# Patient Record
Sex: Female | Born: 1975 | Race: White | Hispanic: No | Marital: Married | Smoking: Never smoker
Health system: Southern US, Community
[De-identification: ages and names within clinical notes are randomized; demographics above are authoritative.]

## PROBLEM LIST (undated history)

## (undated) DIAGNOSIS — G43909 Migraine, unspecified, not intractable, without status migrainosus: Secondary | ICD-10-CM

## (undated) DIAGNOSIS — J45909 Unspecified asthma, uncomplicated: Secondary | ICD-10-CM

## (undated) HISTORY — DX: Unspecified asthma, uncomplicated: J45.909

## (undated) HISTORY — DX: Migraine, unspecified, not intractable, without status migrainosus: G43.909

## (undated) HISTORY — PX: HEMORROIDECTOMY: SUR656

---

## 2016-01-26 ENCOUNTER — Ambulatory Visit: Payer: BLUE CROSS/BLUE SHIELD | Attending: Obstetrics & Gynecology | Admitting: Physical Therapy

## 2016-01-26 DIAGNOSIS — M6281 Muscle weakness (generalized): Secondary | ICD-10-CM | POA: Diagnosis present

## 2016-01-26 DIAGNOSIS — M79604 Pain in right leg: Secondary | ICD-10-CM | POA: Insufficient documentation

## 2016-01-26 DIAGNOSIS — M545 Low back pain, unspecified: Secondary | ICD-10-CM

## 2016-01-26 NOTE — Therapy (Signed)
[facility] MEDICINE [address] [place], [address] Phone: [phone]   Fax:  [phone]  Physical Therapy Evaluation  Patient Details  Name: Tasha Chang MRN: [id number] Date of Birth: [birth year] Referring Provider: ward, Leeroy Bock  Encounter Date: 01/26/2016      PT End of Session - 01/26/16 1702    Visit Number 1   Number of Visits 13   Date for PT Re-Evaluation 03/08/16   PT Start Time 1515   PT Stop Time 1610   PT Time Calculation (min) 55 min   Activity Tolerance Patient tolerated treatment well      No past medical history on file.  No past surgical history on file.  There were no vitals filed for this visit.       Subjective Assessment - 01/26/16 1515    Subjective Pt reports several year history of RLE injuries, including R HS tear several years ago, torn gastroc muscle. Pt also is noting steadily worsening back pain as well.  Pt was issued HS exercises which flared up her back significantly. Now pain is significant in HS and back.    Pertinent History Pt is unable to run due to HS pain.   Patient Stated Goals return to yoga, running pain free.   Currently in Pain? Yes   Pain Score 1    Pain Location Back            [facility] PT Assessment - 01/26/16 0001      Assessment   Medical Diagnosis torn hamstring   Referring Provider ward, chelsea   Prior Therapy none     Precautions   Precautions None     Restrictions   Weight Bearing Restrictions No     Balance Screen   Has the patient fallen in the past 6 months No   Has the patient had a decrease in activity level because of a fear of falling?  No   Is the patient reluctant to leave their home because of a fear of falling?  No     Home Tourist information centre manager residence     Prior Function   Level of Independence Independent   Vocation Full time employment   Vocation Requirements yoga teacher, Lexicographer,  requiring overhead signifiant strength and flexibility   Leisure playing with kids.     Functional Tests   Functional tests Other     Other:   Other/ Comments performed gait, squat, stairs, step down, single leg stance. all negative except gait with significant lack of lumbar rotation, incr. lumbar lordosis     Posture/Postural Control   Posture Comments incr. lumbar lordosis     ROM / Strength   AROM / PROM / Strength AROM;PROM;Strength     AROM   Overall AROM Comments Pt demonstrates appropriate level of mobility for a yoga instructor except significantly incr. ER B in hips.  lumbar extension limited and painful, yoga poses involving end range flexion and extension also painful.     PROM   Overall PROM Comments PROM = AROM     Strength   Overall Strength Comments strength is grossly WNL except medial HS 3/5 and painful.     Palpation   Spinal mobility pain with CPA and L UPAs L1-L5, worse in lower lumbar region.   SI assessment  pain with palpation of SI, pain with palpation of sacrotuberous ligament   Palpation comment pain with palpation of medial posterior thigh just  lateral to medial HS.             Objective: Heel slide on and off table. Performed with cuing to maintain neutral back, hollow body position.  Pt verbalized understanding of the exercise and will perform between now and next session.              PT Education - 01/26/16 1701    Education provided Yes   Education Details heel slide exercise to perform hollow body progression   Person(s) Educated Patient   Methods Explanation   Comprehension Verbalized understanding             PT Long Term Goals - 01/26/16 1710      PT LONG TERM GOAL #1   Title Pt will improve R HS strength to 5/5 pain free.   Baseline 3/5 with pain   Time 6   Period Weeks   Status New     PT LONG TERM GOAL #2   Title Pt will be able to perform full back extension necessary for work pain free   Baseline  5/10 pain in back with performance   Time 6   Period Weeks   Status New     PT LONG TERM GOAL #3   Title Pt will be able to run pain free for fitness   Baseline pain in HS with running    Time 6   Period Weeks   Status New               Plan - 01/26/16 1703    Clinical Impression Statement Pt is a pleasant 40 y/o female with c/o R HS, low back pain. Pt has had pain chronically for the past 3 yrs, worsening considerably in the past 6 months. Pt is a Marine scientist and aerialist and is able to perform all exercises with pain. Currently pt presents with pain with palpation of sacrotuberous ligament, minimal but present urinary incontinence, pain with palpation of HS, decr. lumbar spine ROM. Pt would benefit from skilled PT to address these deficits.   Rehab Potential Fair   Clinical Impairments Affecting Rehab Potential motivation, activity level.   PT Frequency 2x / week   PT Duration 6 weeks   PT Treatment/Interventions Aquatic Therapy;ADLs/Self Care Home Management;Passive range of motion;Dry needling;Therapeutic exercise;Therapeutic activities;Manual techniques   PT Next Visit Plan progress hollow body      Patient will benefit from skilled therapeutic intervention in order to improve the following deficits and impairments:  Decreased strength, Pain, Improper body mechanics, Hypomobility, Postural dysfunction  Visit Diagnosis: Pain In Right Leg - Plan: PT plan of care cert/re-cert  Bilateral low back pain without sciatica - Plan: PT plan of care cert/re-cert  Muscle weakness (generalized) - Plan: PT plan of care cert/re-cert     Problem List There are no active problems to display for this patient.   Beaux Wedemeyer PT DPT 01/26/2016, 5:15 PM  [place] [facility] PHYSICAL AND SPORTS MEDICINE [address], [place], [address] Phone: [phone]   Fax:  [phone]  Name: Tasha Chang MRN: [id number] Date of Birth:  May 20, 1976

## 2016-01-29 ENCOUNTER — Ambulatory Visit: Payer: BLUE CROSS/BLUE SHIELD | Attending: Obstetrics & Gynecology | Admitting: Physical Therapy

## 2016-01-29 DIAGNOSIS — M6281 Muscle weakness (generalized): Secondary | ICD-10-CM | POA: Diagnosis present

## 2016-01-29 DIAGNOSIS — M545 Low back pain, unspecified: Secondary | ICD-10-CM

## 2016-01-29 DIAGNOSIS — M79604 Pain in right leg: Secondary | ICD-10-CM | POA: Insufficient documentation

## 2016-01-29 NOTE — Therapy (Signed)
[facility]TS MEDICINE [address] [place], [address] Phone: [phone]   Fax:  [phone]  Physical Therapy Treatment  Patient Details  Name: Tasha Chang MRN: [id number] Date of Birth: 1976-01-13 Referring Provider: ward, Leeroy Bock  Encounter Date: 01/29/2016      PT End of Session - 01/29/16 1441    Visit Number 2   Number of Visits 13   Date for PT Re-Evaluation 03/08/16   PT Start Time 1340   PT Stop Time 1435   PT Time Calculation (min) 55 min   Activity Tolerance Patient tolerated treatment well      No past medical history on file.  No past surgical history on file.  There were no vitals filed for this visit.      Subjective Assessment - 01/29/16 1345    Subjective (P)  Pt reports she is having incr. back pain. noted difficulty with hollow body to superman progression.   Pertinent History (P)  Pt is unable to run due to HS pain.   Patient Stated Goals (P)  return to yoga, running pain free.   Currently in Pain? (P)  Yes   Pain Score (P)  1               Objective: CPAs grade IV 3x1 min T10-S1  Prone press up with manual overpressure 3x1 min.  Following this pt had 0/10 pain with prone press up.  Assessed running, noted very slow cadence of 110. Educated pt on progressing cadence to 160.   Educated pt on progressive running including avoiding excessive pain, differentiating pain from discomfort.  Performed plank, pain with performance due to firing of paraspinals. With extensive cuing for core engagement pt able to perform concentric phase pain free. Initially had difficulty with eccentric phase. With cuing able to correct this as well and perform fully pain free plank.  Reviewed split, modified split, pt had some c/o pulling in back with this.  Attempted superman, pt has pain with performance of this, able to correct with same cuing as performed with plank.  Encouraged pt to  discontinue hollow body to superman.  Performed standing pelvic tilt, noted incr. Hip flexion, cued to correct this with HS, then performed light leg swings to progress to crescent pose.                   PT Education - 01/29/16 1441    Education provided Yes   Education Details progression of running   Person(s) Educated Patient   Methods Explanation   Comprehension Verbalized understanding             PT Long Term Goals - 01/26/16 1710      PT LONG TERM GOAL #1   Title Pt will improve R HS strength to 5/5 pain free.   Baseline 3/5 with pain   Time 6   Period Weeks   Status New     PT LONG TERM GOAL #2   Title Pt will be able to perform full back extension necessary for work pain free   Baseline 5/10 pain in back with performance   Time 6   Period Weeks   Status New     PT LONG TERM GOAL #3   Title Pt will be able to run pain free for fitness   Baseline pain in HS with running    Time 6   Period Weeks   Status New  Plan - 01/29/16 1441    Clinical Impression Statement Progressed pt to return to running program, pt has very low cadence so extensive work done on this. Pt also demonstrates significant improvement in pelvic control within session with decr. pain in back when focusing on this.   Rehab Potential Fair   Clinical Impairments Affecting Rehab Potential motivation, activity level.   PT Frequency 2x / week   PT Duration 6 weeks   PT Treatment/Interventions Aquatic Therapy;ADLs/Self Care Home Management;Passive range of motion;Dry needling;Therapeutic exercise;Therapeutic activities;Manual techniques   PT Next Visit Plan progress hollow body      Patient will benefit from skilled therapeutic intervention in order to improve the following deficits and impairments:  Decreased strength, Pain, Improper body mechanics, Hypomobility, Postural dysfunction  Visit Diagnosis: Bilateral low back pain without sciatica  Pain In  Right Leg     Problem List There are no active problems to display for this patient.   Shaunak Kreis PT DPT 01/29/2016, 2:54 PM  [place] [facility] PHYSICAL AND SPORTS MEDICINE [address] [place], [address] Phone: [phone]   Fax:  [phone]  Name: Tasha Chang MRN: [id number] Date of Birth: 03/21/1976

## 2016-02-03 ENCOUNTER — Ambulatory Visit: Payer: BLUE CROSS/BLUE SHIELD | Admitting: Physical Therapy

## 2016-02-03 DIAGNOSIS — M545 Low back pain, unspecified: Secondary | ICD-10-CM

## 2016-02-03 DIAGNOSIS — M79604 Pain in right leg: Secondary | ICD-10-CM

## 2016-02-03 NOTE — Therapy (Signed)
[facility] [address] [place], [place], [address] Phone: [phone]   Fax:  [phone]  Physical Therapy Treatment  Patient Details  Name: Tasha Chang MRN: [id number] Date of Birth: 12-Jul-1975 Referring Provider: ward, Leeroy Bockchelsea  Encounter Date: 02/03/2016      PT End of Session - 02/03/16 1030    Visit Number 3   Number of Visits 13   Date for PT Re-Evaluation 03/08/16   PT Start Time 0945   PT Stop Time 1030   PT Time Calculation (min) 45 min   Activity Tolerance Patient tolerated treatment well      No past medical history on file.  No past surgical history on file.  There were no vitals filed for this visit.      Subjective Assessment - 02/03/16 0944    Subjective Pt reports incr. stiffness in back.   Pertinent History Pt is unable to run due to HS pain.   Patient Stated Goals return to yoga, running pain free.   Currently in Pain? No/denies   Pain Score 0-No pain              Objective:  HS curl on ball with extensive practice to perform with decr. Back activation, cuing for pelvic tilt, breathing.  Warrior 1 position, squaring hips, stretching into this to address psoas tightness, initially produced HS pain, with cuing to perform contract relax able to perform well, 15 min practice to produce single exercise.  Bridge progressing to single leg bridge, with adduction pt had difficulty with performance of this.  Discontinued performance of leg swing due to no difficulty with performance of things.                    PT Education - 02/03/16 1030    Education provided Yes   Education Details HEP   Person(s) Educated Patient   Methods Explanation   Comprehension Verbalized understanding             PT Long Term Goals - 01/26/16 1710      PT LONG TERM GOAL #1   Title Pt will improve R HS strength to 5/5 pain free.   Baseline 3/5 with pain   Time 6   Period Weeks   Status New     PT LONG TERM GOAL #2   Title Pt will be able to perform full back extension necessary for work pain free   Baseline 5/10 pain in back with performance   Time 6   Period Weeks   Status New     PT LONG TERM GOAL #3   Title Pt will be able to run pain free for fitness   Baseline pain in HS with running    Time 6   Period Weeks   Status New               Plan - 02/03/16 1031    Clinical Impression Statement noted hypermobility in B hips. Pt also definite improvement in motor control within session, able to perform exercises which demand core control and HS activation required extensive cuing.    Rehab Potential Fair   Clinical Impairments Affecting Rehab Potential motivation, activity level.   PT Frequency 2x / week   PT Duration 6 weeks   PT Treatment/Interventions Aquatic Therapy;ADLs/Self Care Home Management;Passive range of motion;Dry needling;Therapeutic exercise;Therapeutic activities;Manual techniques   PT Next Visit Plan progress hollow body      Patient will  benefit from skilled therapeutic intervention in order to improve the following deficits and impairments:  Decreased strength, Pain, Improper body mechanics, Hypomobility, Postural dysfunction  Visit Diagnosis: Bilateral low back pain without sciatica  Pain In Right Leg     Problem List There are no active problems to display for this patient.   Fisher,Benjamin PT DPT 02/03/2016, 10:37 AM  [facility] PHYSICAL AND SPORTS MEDICINE [address] [place], [address] Phone: [phone]   Fax:  [phone]  Name: Tasha Chang MRN: [id number] Date of Birth: 06-19-1976

## 2016-02-05 ENCOUNTER — Encounter: Payer: BLUE CROSS/BLUE SHIELD | Admitting: Physical Therapy

## 2016-02-06 ENCOUNTER — Ambulatory Visit: Payer: BLUE CROSS/BLUE SHIELD | Admitting: Physical Therapy

## 2016-02-06 DIAGNOSIS — M6281 Muscle weakness (generalized): Secondary | ICD-10-CM

## 2016-02-06 DIAGNOSIS — M545 Low back pain, unspecified: Secondary | ICD-10-CM

## 2016-02-06 NOTE — Therapy (Signed)
[facility] [address] [place], [place], [address] Phone: [phone]   Fax:  [phone]  Physical Therapy Treatment  Patient Details  Name: Tasha Chang MRN: [id number] Date of Birth: 1976-04-10 Referring Provider: ward, Leeroy Bockchelsea  Encounter Date: 02/06/2016      PT End of Session - 02/06/16 1201    Visit Number 4   Number of Visits 13   Date for PT Re-Evaluation 03/08/16   PT Start Time 1100   PT Stop Time 1150   PT Time Calculation (min) 50 min   Activity Tolerance Patient tolerated treatment well      No past medical history on file.  No past surgical history on file.  There were no vitals filed for this visit.      Subjective Assessment - 02/06/16 1058    Subjective Pt recorded several videos to show aerial yoga movements that are irritating to back   Pertinent History Pt is unable to run due to HS pain.   Patient Stated Goals return to yoga, running pain free.   Currently in Pain? No/denies                                      PT Long Term Goals - 01/26/16 1710      PT LONG TERM GOAL #1   Title Pt will improve R HS strength to 5/5 pain free.   Baseline 3/5 with pain   Time 6   Period Weeks   Status New     PT LONG TERM GOAL #2   Title Pt will be able to perform full back extension necessary for work pain free   Baseline 5/10 pain in back with performance   Time 6   Period Weeks   Status New     PT LONG TERM GOAL #3   Title Pt will be able to run pain free for fitness   Baseline pain in HS with running    Time 6   Period Weeks   Status New               Plan - 02/06/16 1201    Clinical Impression Statement overall pt is improving in control, is still having some back and HS pain but this appears to be improving with modifications of activity and manual therapy for back.   Rehab Potential Fair   Clinical Impairments Affecting Rehab  Potential motivation, activity level.   PT Frequency 2x / week   PT Duration 6 weeks   PT Treatment/Interventions Aquatic Therapy;ADLs/Self Care Home Management;Passive range of motion;Dry needling;Therapeutic exercise;Therapeutic activities;Manual techniques   PT Next Visit Plan progress hollow body      Patient will benefit from skilled therapeutic intervention in order to improve the following deficits and impairments:  Decreased strength, Pain, Improper body mechanics, Hypomobility, Postural dysfunction  Visit Diagnosis: Bilateral low back pain without sciatica  Muscle weakness (generalized)     Problem List There are no active problems to display for this patient.   Fisher,Benjamin PT DPT 02/06/2016, 12:03 PM  [place] [facility] [address] [place], [place], [address] Phone: [phone]   Fax:  [phone]  Name: Tasha Chang MRN: [id number] Date of Birth: 1976-04-10

## 2016-02-10 ENCOUNTER — Ambulatory Visit: Payer: BLUE CROSS/BLUE SHIELD | Admitting: Physical Therapy

## 2016-02-10 DIAGNOSIS — M545 Low back pain, unspecified: Secondary | ICD-10-CM

## 2016-02-11 NOTE — Therapy (Signed)
[facility]YS[facility] MEDICINE [address] [place], [place], [address] Phone: [phone]   Fax:  [phone]  Physical Therapy Treatment  Patient Details  Name: Tasha Chang MRN: [id number] Date of Birth: 1975-08-05 Referring Provider: ward, Leeroy Bockchelsea  Encounter Date: 02/10/2016      PT End of Session - 02/10/16 0717    Visit Number 5   Number of Visits 13   Date for PT Re-Evaluation 03/08/16   PT Start Time 1130   PT Stop Time 1215   PT Time Calculation (min) 45 min   Activity Tolerance Patient tolerated treatment well      No past medical history on file.  No past surgical history on file.  There were no vitals filed for this visit.      Subjective Assessment - 02/10/16 1124    Subjective Pt reports she is "no longer feeling like an old person"   Pertinent History Pt is unable to run due to HS pain.   Patient Stated Goals return to yoga, running pain free.             Objective: Jefferson curl with 20# on 12" step x3, extensive cuing for apropriate performance of this.   This curl is similar to specific movements pt is required to do for work but is currently unable to perform.  Reviewed hollow body hold, corrected breathing with this.  Sun salutation, noted poor extension in upward dog which pt utilizes for work, able to correct with manual cues, verbal cues regarding TA contraction.  Lateral flexion stretch with manual QL release/repositioning, x4 min each side.                         PT Long Term Goals - 01/26/16 1710      PT LONG TERM GOAL #1   Title Pt will improve R HS strength to 5/5 pain free.   Baseline 3/5 with pain   Time 6   Period Weeks   Status New     PT LONG TERM GOAL #2   Title Pt will be able to perform full back extension necessary for work pain free   Baseline 5/10 pain in back with performance   Time 6   Period Weeks   Status New     PT LONG TERM  GOAL #3   Title Pt will be able to run pain free for fitness   Baseline pain in HS with running    Time 6   Period Weeks   Status New               Plan - 02/10/16 0717    Clinical Impression Statement pt is now able to extend back to50% normal, compared to 30% at start of PT. when performing upward dog she still must have knees supported or she has pain but otherwise is able to make significant improvement.   Rehab Potential Fair   Clinical Impairments Affecting Rehab Potential motivation, activity level.   PT Frequency 2x / week   PT Duration 6 weeks   PT Treatment/Interventions Aquatic Therapy;ADLs/Self Care Home Management;Passive range of motion;Dry needling;Therapeutic exercise;Therapeutic activities;Manual techniques   PT Next Visit Plan progress hollow body      Patient will benefit from skilled therapeutic intervention in order to improve the following deficits and impairments:  Decreased strength, Pain, Improper body mechanics, Hypomobility, Postural dysfunction  Visit Diagnosis: Bilateral low back pain without sciatica  Problem List There are no active problems to display for this patient.   Fisher,Benjamin PT DPT 02/11/2016, 7:19 AM  [facility] [address] [place], [place], [address] Phone: [phone]   Fax:  [phone]  Name: Tasha Chang MRN: [id number] Date of Birth: [birth year]

## 2016-02-18 ENCOUNTER — Ambulatory Visit: Payer: BLUE CROSS/BLUE SHIELD | Admitting: Physical Therapy

## 2016-02-18 DIAGNOSIS — M545 Low back pain, unspecified: Secondary | ICD-10-CM

## 2016-02-19 NOTE — Therapy (Signed)
[facility] MEDIC[facility] [address] [place], [place], [address] Phone: [phone]   Fax:  [phone]  Physical Therapy Treatment  Patient Details  Name: Tasha Chang MRN: [id number] Date of Birth: 07-Nov-1975 Referring Provider: ward, Leeroy Bockchelsea  Encounter Date: 02/18/2016      PT End of Session - 02/18/16 0716    Visit Number 6   Number of Visits 13   Date for PT Re-Evaluation 03/08/16   PT Start Time 1130   PT Stop Time 1210   PT Time Calculation (min) 40 min   Activity Tolerance Patient tolerated treatment well      No past medical history on file.  No past surgical history on file.  There were no vitals filed for this visit.      Subjective Assessment - 02/18/16 0715    Subjective Pt has been doing exceptionally well, yesterday had incr. pain and is now feeling "like an old person".   Pertinent History Pt is unable to run due to HS pain.   Patient Stated Goals return to yoga, running pain free.   Currently in Pain? Yes   Pain Score 2    Pain Location Back            Objective: Modified yoga triangle pose to avoid end range pain.  Modified aerial holds to improve lat activation.  Sun salutation with pain initially with forward flexion. Performed manual STM on paraspinals, following this decr. Pain with forward flexion.  Utilized bolster for additional performance of this.  Performed plank with cuing for improved breathing and sense of "ease" with performance of this.  At end of session pt reported decr. Resting pain to 0/10.  Extensive education on taking one week off prior to return to higher level activity. Pt will perform light active recovery routine until then.                     PT Education - 02/19/16 0716    Education provided Yes   Education Details deload principles   Person(s) Educated Patient   Methods Explanation   Comprehension Verbalized understanding              PT Long Term Goals - 01/26/16 1710      PT LONG TERM GOAL #1   Title Pt will improve R HS strength to 5/5 pain free.   Baseline 3/5 with pain   Time 6   Period Weeks   Status New     PT LONG TERM GOAL #2   Title Pt will be able to perform full back extension necessary for work pain free   Baseline 5/10 pain in back with performance   Time 6   Period Weeks   Status New     PT LONG TERM GOAL #3   Title Pt will be able to run pain free for fitness   Baseline pain in HS with running    Time 6   Period Weeks   Status New               Plan - 02/18/16 0717    Clinical Impression Statement Incr. pain appears to be related to significantly incr. 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 Focus of session on pain relief and issuing HEP based around recovery from overdoing her work related activities.   Rehab Potential Fair   Clinical Impairments Affecting Rehab Potential motivation, activity level.  PT Frequency 2x / week   PT Duration 6 weeks   PT Treatment/Interventions Aquatic Therapy;ADLs/Self Care Home Management;Passive range of motion;Dry needling;Therapeutic exercise;Therapeutic activities;Manual techniques   PT Next Visit Plan progress hollow body      Patient will benefit from skilled therapeutic intervention in order to improve the following deficits and impairments:  Decreased strength, Pain, Improper body mechanics, Hypomobility, Postural dysfunction  Visit Diagnosis: Bilateral low back pain without sciatica     Problem List There are no active problems to display for this patient.   Fisher,Benjamin PT DPT 02/19/2016, 7:22 AM  [facility] MEDIC[facility] [address] [place], [place], [address] Phone: [phone]   Fax:  [phone]  Name: Tasha Chang MRN: [id number] Date of Birth: 29-Nov-1975

## 2016-02-24 ENCOUNTER — Ambulatory Visit: Payer: BLUE CROSS/BLUE SHIELD | Admitting: Physical Therapy

## 2016-02-24 DIAGNOSIS — M545 Low back pain, unspecified: Secondary | ICD-10-CM

## 2016-02-24 DIAGNOSIS — M79604 Pain in right leg: Secondary | ICD-10-CM

## 2016-02-25 NOTE — Therapy (Signed)
[place] [facility] [address], [place], [address] Phone: [phone]   Fax:  [phone]  Physical Therapy Treatment  Patient Details  Name: Tasha Chang MRN: [id number] Date of Birth: [birth year] Referring Provider: ward, Leeroy Bock  Encounter Date: 02/24/2016      PT End of Session - 02/24/16 6962    Visit Number 7   Number of Visits 13   Date for PT Re-Evaluation 03/08/16   PT Start Time 1045   PT Stop Time 1130   PT Time Calculation (min) 45 min   Activity Tolerance Patient tolerated treatment well      No past medical history on file.  No past surgical history on file.  There were no vitals filed for this visit.      Subjective Assessment - 02/24/16 0641    Subjective Overall pain has improved, pt is noting some global pain today possibly due to extended period of sitting over the weekend   Pertinent History Pt is unable to run due to HS pain.   Patient Stated Goals return to yoga, running pain free.   Currently in Pain? No/denies   Pain Score 0-No pain               Objective: Extensive and comprehensive STM performed on B QL, erector spinae, paraspinals, superior fibers of glutes, as well as MFR performed on thoracodorsal fascia.  Following this pt consented to TDN following education regarding risks. Performed maceration TDN on B QL and errector spinae to improve response to manual intervention and to improve motor control with there ex.  Pt then performed light "yin yoga" stretching, going to 70% of her range and performing breathing exercises in this position to address maintaining new pain free ROM, including side bending, child's pose, upward dog.  Following this pt reported she was pain free in child's pose for the "first time in 3 years".                  PT Education - 02/24/16 [phone]    Education provided Yes   Education Details dry needling   Person(s)  Educated Patient   Methods Explanation   Comprehension Verbalized understanding             PT Long Term Goals - 01/26/16 1710      PT LONG TERM GOAL #1   Title Pt will improve R HS strength to 5/5 pain free.   Baseline 3/5 with pain   Time 6   Period Weeks   Status New     PT LONG TERM GOAL #2   Title Pt will be able to perform full back extension necessary for work pain free   Baseline 5/10 pain in back with performance   Time 6   Period Weeks   Status New     PT LONG TERM GOAL #3   Title Pt will be able to run pain free for fitness   Baseline pain in HS with running    Time 6   Period Weeks   Status New               Plan - 02/24/16 4132    Clinical Impression Statement Pt pain fully abolished and motor control improved considerably with manual therapy and trigger point dry needling within session. 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   Rehab Potential Fair   Clinical Impairments Affecting Rehab Potential motivation, activity  level.   PT Frequency 2x / week   PT Duration 6 weeks   PT Treatment/Interventions Aquatic Therapy;ADLs/Self Care Home Management;Passive range of motion;Dry needling;Therapeutic exercise;Therapeutic activities;Manual techniques   PT Next Visit Plan progress hollow body      Patient will benefit from skilled therapeutic intervention in order to improve the following deficits and impairments:  Decreased strength, Pain, Improper body mechanics, Hypomobility, Postural dysfunction  Visit Diagnosis: Bilateral low back pain without sciatica  Pain In Right Leg     Problem List There are no active problems to display for this patient.   Gailyn Crook PT DPT 02/25/2016, 6:48 AM  [facility] [address] [place], [place], [address] Phone: [phone]   Fax:  [phone]  Name: Tasha Chang MRN: [id number] Date of Birth:  03-17-1976

## 2016-02-27 ENCOUNTER — Ambulatory Visit: Payer: BLUE CROSS/BLUE SHIELD | Attending: Obstetrics & Gynecology | Admitting: Physical Therapy

## 2016-02-27 DIAGNOSIS — M79604 Pain in right leg: Secondary | ICD-10-CM | POA: Diagnosis present

## 2016-02-27 DIAGNOSIS — M6281 Muscle weakness (generalized): Secondary | ICD-10-CM | POA: Diagnosis present

## 2016-02-27 DIAGNOSIS — M545 Low back pain, unspecified: Secondary | ICD-10-CM

## 2016-02-27 NOTE — Therapy (Signed)
[facility] [address], [place], [address] Phone: [phone]   Fax:  [phone]  Physical Therapy Treatment  Patient Details  Name: Emelly Wurtz MRN: [id number] Date of Birth: 01-Sep-1975 Referring Provider: ward, Leeroy Bock  Encounter Date: 02/27/2016      PT End of Session - 02/27/16 1021    Visit Number 8   Number of Visits 13   Date for PT Re-Evaluation 03/08/16   PT Start Time 0815   PT Stop Time 0830   PT Time Calculation (min) 15 min   Activity Tolerance Patient tolerated treatment well      No past medical history on file.  No past surgical history on file.  There were no vitals filed for this visit.      Subjective Assessment - 02/27/16 0814    Subjective Pt reports she is "still struggling" with her side bending and is in moderate pain.   Pertinent History Pt is unable to run due to HS pain.   Patient Stated Goals return to yoga, running pain free.   Currently in Pain? Yes   Pain Score 2    Pain Location Back                     Objective: CPAs grade III 2x1 min L2-L5 Following this pt reported minimal pain at rest.   Pain with forward flexion in paraspinals  Performed trigger point dry needling on B lumbar paraspinals and erector spinae. Utilized maceration technique only (no charge)  Following this pt reported decr. Pain with lumbar flexion.  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                 PT Long Term Goals - 01/26/16 1710      PT LONG TERM GOAL #1   Title Pt will improve R HS strength to 5/5 pain free.   Baseline 3/5 with pain   Time 6   Period Weeks   Status New     PT LONG TERM GOAL #2   Title Pt will be able to perform full back extension necessary for work pain free   Baseline 5/10 pain in back with performance   Time 6    Period Weeks   Status New     PT LONG TERM GOAL #3   Title Pt will be able to run pain free for fitness   Baseline pain in HS with running    Time 6   Period Weeks   Status New               Plan - 02/27/16 1021    Clinical Impression Statement Pt appears to be plateauing. Likely related to incr. 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 responded well to manual intervention today. Will look to issue HEP at next session as pt needs to end therapy soon.   Rehab Potential Fair   Clinical Impairments Affecting Rehab Potential motivation, activity level.   PT Frequency 2x / week   PT Duration 6 weeks   PT Treatment/Interventions Aquatic Therapy;ADLs/Self Care Home Management;Passive range of motion;Dry needling;Therapeutic exercise;Therapeutic activities;Manual techniques   PT Next Visit Plan progress hollow body      Patient will benefit from skilled therapeutic intervention in order to improve the following deficits and impairments:  Decreased strength, Pain, Improper  body mechanics, Hypomobility, Postural dysfunction  Visit Diagnosis: Bilateral low back pain without sciatica     Problem List There are no active problems to display for this patient.   Maddyn Lieurance PT DPT 02/27/2016, 10:24 AM  [place] [facility] [address] [place], [place], [address] Phone: [phone]   Fax:  [phone]  Name: Donna ChristenMichelle Cryer MRN: [id number] Date of Birth: January 31, 1976

## 2016-03-02 ENCOUNTER — Ambulatory Visit: Payer: BLUE CROSS/BLUE SHIELD | Admitting: Physical Therapy

## 2016-03-02 DIAGNOSIS — M545 Low back pain, unspecified: Secondary | ICD-10-CM

## 2016-03-03 ENCOUNTER — Ambulatory Visit: Payer: BLUE CROSS/BLUE SHIELD | Admitting: Physical Therapy

## 2016-03-03 NOTE — Therapy (Signed)
[place] [facility] [address] [place], [place], [address] Phone: [phone]   Fax:  [phone]  Physical Therapy Treatment  Patient Details  Name: Donna ChristenMichelle Koskela MRN: [id number] Date of Birth: Nov 24, 1975 Referring Provider: ward, Leeroy Bockchelsea  Encounter Date: 03/02/2016      PT End of Session - 03/02/16 0820    Visit Number 9   Number of Visits 13   Date for PT Re-Evaluation 03/08/16   PT Start Time 0745   PT Stop Time 0815   PT Time Calculation (min) 30 min   Activity Tolerance Patient tolerated treatment well      No past medical history on file.  No past surgical history on file.  There were no vitals filed for this visit.      Subjective Assessment - 03/02/16 0816    Subjective Pt is now fully pain free, is still having some difficulty with pain with her upward dog.   Pertinent History Pt is unable to run due to HS pain.   Patient Stated Goals return to yoga, running pain free.   Currently in Pain? No/denies   Pain Score 0-No pain               Objective: CPAs grade IV 2x30 oscillations R UPAs same grade same location, both performed at L1-L4   Trigger point dry needling on R paraspinals and erector spinae. Following this pt reported no stiffness with higher level yoga positions.  Reassessed and corrected plank performance, including earlier activation of TA for improved control. Added into multiple positions including plank, updog, mountain, pigeon. Pt able to perform well.  Pt required cuing and correction with exercise, stiffness with positioning improved with manual intervention.                  PT Education - 03/02/16 0817    Education provided Yes   Education Details motor control with upward dog    Person(s) Educated Patient   Methods Explanation             PT Long Term Goals - 01/26/16 1710      PT LONG TERM GOAL #1   Title Pt will improve R HS strength  to 5/5 pain free.   Baseline 3/5 with pain   Time 6   Period Weeks   Status New     PT LONG TERM GOAL #2   Title Pt will be able to perform full back extension necessary for work pain free   Baseline 5/10 pain in back with performance   Time 6   Period Weeks   Status New     PT LONG TERM GOAL #3   Title Pt will be able to run pain free for fitness   Baseline pain in HS with running    Time 6   Period Weeks   Status New               Plan - 03/02/16 0820    Clinical Impression Statement Pt is making progress in terms of pain, functional status. Did notice some incr. pain today within session on R paraspinals. May be appropriate for d/c at next session as she is I with HEP and is having minimal dificulty with higher level activities.   Rehab Potential Fair   Clinical Impairments Affecting Rehab Potential motivation, activity level.   PT Frequency 2x / week   PT Duration 6 weeks   PT Treatment/Interventions Aquatic Therapy;ADLs/Self Care Home  Management;Passive range of motion;Dry needling;Therapeutic exercise;Therapeutic activities;Manual techniques   PT Next Visit Plan progress hollow body      Patient will benefit from skilled therapeutic intervention in order to improve the following deficits and impairments:  Decreased strength, Pain, Improper body mechanics, Hypomobility, Postural dysfunction  Visit Diagnosis: Bilateral low back pain without sciatica     Problem List There are no active problems to display for this patient.   Miaa Latterell PT DPT 03/03/2016, 7:53 AM  [place] [facility] PH[facility] AND SPORTS MEDICINE [address], [place], [address] Phone: [phone]   Fax:  [phone]  Name: Susannah Carbin MRN: [id number] Date of Birth: [birth year]

## 2016-03-09 ENCOUNTER — Ambulatory Visit: Payer: BLUE CROSS/BLUE SHIELD | Admitting: Physical Therapy

## 2016-03-09 DIAGNOSIS — M6281 Muscle weakness (generalized): Secondary | ICD-10-CM

## 2016-03-09 DIAGNOSIS — M545 Low back pain: Secondary | ICD-10-CM | POA: Diagnosis not present

## 2016-03-09 DIAGNOSIS — M79604 Pain in right leg: Secondary | ICD-10-CM

## 2016-03-09 NOTE — Therapy (Signed)
[place] [facility] SPORTS MEDIC[facility] [address] [place], [address] Phone: [phone]   Fax:  [phone]  Physical Therapy Treatment  Patient Details  Name: Tasha Chang MRN: [id number] Date of Birth: 09-12-1975 Referring Provider: ward, Vikki Ports  Encounter Date: 03/09/2016      PT End of Session - 03/09/16 1011    Visit Number 10   Number of Visits 20   Date for PT Re-Evaluation 04/20/16   PT Start Time 0900   PT Stop Time 0945   PT Time Calculation (min) 45 min   Activity Tolerance Patient tolerated treatment well      No past medical history on file.  No past surgical history on file.  There were no vitals filed for this visit.      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   Pertinent History Pt is unable to run due to HS pain.   Patient Stated Goals return to yoga, running pain free.   Currently in Pain? Yes   Pain Score 2    Pain Location Hip               Objective: Reassessed movement based on pt report of incr. HS pain. Noted HS strength of 4/5.  HS bridge performed with wide set legs, narrow legs, single leg on LLE (good leg) deferred on RLE. Pt required extensive practice, cuing for posterior pelvic tilt to decr. Back irritation.  Figure four stretch - pain with muscle activation in this position. Noted incr. Pain with this.  Graston technique performed on insertion of HS x8 min using tool # 4 and J-stroke, petrissage technique. Following this decr. Figure four stretch.  Figure four from sitting to high kneeling practice, x5 min practice, x10 reps with GTB for resistance to achieve neutral. Pt able to perform this well.                    PT Education - 03/09/16 1011    Education provided  Yes   Education Details new HEP   Person(s) Educated Patient   Methods Explanation   Comprehension Verbalized understanding             PT Long Term Goals - 03/09/16 1014      PT LONG TERM GOAL #1   Title Pt will improve R HS strength to 5/5 pain free.   Baseline 4/5 with pain   Time 6   Period Weeks   Status Partially Met     PT LONG TERM GOAL #2   Title Pt will be able to perform full back extension necessary for work pain free   Baseline now no pain, some decr. ROM   Time 6   Period Weeks   Status Partially Met     PT LONG TERM GOAL #3   Title Pt will be able to run pain free for fitness   Baseline pain in HS with running    Time 6   Period Weeks   Status Achieved               Plan - 03/09/16 1011    Clinical Impression Statement Pt appears to be continuing to progress, despite setback in terms of HS pain since previous session. Although pt does have moderate  pain in HS pain is minimal and has clear MOI (overuse from 10 hrs of training over the weekend) and understands how to treat. Will reassess pt in 1 month unless pain returns significantly efore that time. 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 HS weakness and pain is still present so issued new exercises to address this.   Rehab Potential Fair   Clinical Impairments Affecting Rehab Potential motivation, activity level.   PT Frequency 2x / week   PT Duration 6 weeks   PT Treatment/Interventions Aquatic Therapy;ADLs/Self Care Home Management;Passive range of motion;Dry needling;Therapeutic exercise;Therapeutic activities;Manual techniques   PT Next Visit Plan progress hollow body      Patient will benefit from skilled therapeutic intervention in order to improve the following deficits and impairments:  Decreased strength, Pain, Improper body mechanics, Hypomobility, Postural dysfunction  Visit Diagnosis: Pain In Right Leg - Plan: PT  plan of care cert/re-cert  Muscle weakness (generalized) - Plan: PT plan of care cert/re-cert     Problem List There are no active problems to display for this patient.   Rene Gonsoulin PT DPT 03/09/2016, 10:17 AM  [place] [facility]ORTS MEDIC[facility] [address] [place], [address] Phone: [phone]   Fax:  [phone]  Name: Tasha Chang MRN: [id number] Date of Birth: [birth year]

## 2016-04-12 ENCOUNTER — Ambulatory Visit: Payer: BLUE CROSS/BLUE SHIELD | Attending: Obstetrics & Gynecology | Admitting: Physical Therapy

## 2016-04-12 DIAGNOSIS — G8929 Other chronic pain: Secondary | ICD-10-CM | POA: Diagnosis present

## 2016-04-12 DIAGNOSIS — M545 Low back pain, unspecified: Secondary | ICD-10-CM

## 2016-04-12 DIAGNOSIS — M79604 Pain in right leg: Secondary | ICD-10-CM | POA: Insufficient documentation

## 2016-04-13 NOTE — Therapy (Signed)
[place] [facility] AND SPORTS MEDICINE [address] [place], [address] Phone: [phone]   Fax:  [phone]  Physical Therapy Treatment  Patient Details  Name: Tayra Dawe MRN: [id number] Date of Birth: 10-19-1975 Referring Provider: ward, Vikki Ports  Encounter Date: 04/12/2016      PT End of Session - 04/12/16 0812    Visit Number 11   Number of Visits 20   Date for PT Re-Evaluation 04/20/16   PT Start Time 0845   PT Stop Time 0925   PT Time Calculation (min) 40 min   Activity Tolerance Patient tolerated treatment well      No past medical history on file.  No past surgical history on file.  There were no vitals filed for this visit.      Subjective Assessment - 04/12/16 0810    Subjective Pt reports HS pain has resolved. Back pain has worsened but is stable. Pt has performed significantly incr.    Pertinent History Pt is unable to run due to HS pain.   Patient Stated Goals return to yoga, running pain free.   Currently in Pain? Yes   Pain Score 3    Pain Location Back               Objective: Reviewed HEP, corrected standing posture with MET for hip extension.  Reviewed self care exercises. Issued new exercise of deep breathing in plank position. Able to perform 3x5 slow deep breaths in this position with appropriate trunk and pelvic floor activation.  Extensive STM performed on paraspinals, erectors, QL. Following this pt reported decr. Pain to 1/10. Followed up with trigger point dry needling (no charge) for lumbar multifidi. Following this reported 0/10 pain.  Reassessed pt goals, all goals met.                  PT Education - 04/12/16 [phone]    Education provided Yes   Education Details discharge instructions   Person(s) Educated Patient   Methods Explanation             PT Long Term Goals - 04/12/16 0814      PT LONG TERM GOAL #1   Title Pt will improve R HS strength  to 5/5 pain free.   Baseline 4/5 with pain   Time 6   Period Weeks   Status Achieved     PT LONG TERM GOAL #2   Title Pt will be able to perform full back extension necessary for work pain free   Baseline now no pain, some decr. ROM   Time 6   Period Weeks   Status Achieved     PT LONG TERM GOAL #3   Title Pt will be able to run pain free for fitness   Baseline pain in HS with running    Time 6   Period Weeks   Status Achieved               Plan - 04/12/16 4196    Clinical Impression Statement Although pt continues to have mild pain in back, overall her symptoms have improved and pt is I with strategies for improving pain when it worsens. Due to her job requirements she is unlikely to fully resolve pain, however she is able to peform at a high level and manage pain on her own. Encouraged pt to look for massage therapist and to continue with HEP.   Rehab Potential Fair   Clinical Impairments Affecting Rehab  Potential motivation, activity level.   PT Frequency 2x / week   PT Duration 6 weeks   PT Treatment/Interventions Aquatic Therapy;ADLs/Self Care Home Management;Passive range of motion;Dry needling;Therapeutic exercise;Therapeutic activities;Manual techniques   PT Next Visit Plan progress hollow body      Patient will benefit from skilled therapeutic intervention in order to improve the following deficits and impairments:  Decreased strength, Pain, Improper body mechanics, Hypomobility, Postural dysfunction  Visit Diagnosis: Pain in right leg  Chronic bilateral low back pain without sciatica     Problem List There are no active problems to display for this patient.   Navid Lenzen PT DPT 04/13/2016, 8:15 AM  [place] [facility] [address] [place], [address] Phone: [phone]   Fax:  [phone]  Name: Breuna Loveall MRN: [id number] Date of Birth: 12/06/1975

## 2019-04-11 ENCOUNTER — Other Ambulatory Visit: Payer: Self-pay

## 2019-04-11 ENCOUNTER — Ambulatory Visit: Payer: BC Managed Care – PPO

## 2019-04-11 DIAGNOSIS — Z23 Encounter for immunization: Secondary | ICD-10-CM

## 2019-10-08 ENCOUNTER — Other Ambulatory Visit: Payer: Self-pay

## 2019-10-08 ENCOUNTER — Ambulatory Visit: Payer: BC Managed Care – PPO | Attending: Nurse Practitioner | Admitting: Occupational Therapy

## 2019-10-08 DIAGNOSIS — M25531 Pain in right wrist: Secondary | ICD-10-CM | POA: Diagnosis present

## 2019-10-08 DIAGNOSIS — M25532 Pain in left wrist: Secondary | ICD-10-CM | POA: Insufficient documentation

## 2019-10-08 DIAGNOSIS — M6281 Muscle weakness (generalized): Secondary | ICD-10-CM | POA: Diagnosis present

## 2019-10-08 NOTE — Patient Instructions (Signed)
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

## 2019-10-08 NOTE — Therapy (Signed)
[facility] [address] [place], [address] Phone: [phone]   Fax:  [phone]  Occupational Therapy Evaluation  Patient Details  Name: Tasha Chang MRN: [id number] Date of Birth: [birth year] Referring Provider (OT): Meryl Crutch   Encounter Date: 10/08/2019  OT End of Session - 10/08/19 1604    Visit Number  1    Number of Visits  6    Date for OT Re-Evaluation  11/19/19    OT Start Time  1302    OT Stop Time  1359    OT Time Calculation (min)  57 min    Activity Tolerance  Patient tolerated treatment well    Behavior During Therapy  [facility] for tasks assessed/performed       No past medical history on file.   There were no vitals filed for this visit.  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  Yes    Pain Score  6     Pain Location  Wrist    Pain Orientation  Right;Left    Pain Descriptors / Indicators  Aching;Tightness;Shooting    Pain Type  Acute pain    Pain Onset  More than a month ago    Pain Frequency  Intermittent    Aggravating Factors   supination , pronation end range . lifting with palms , weight bearing        [facility] OT Assessment - 10/08/19 0001      Assessment   Medical Diagnosis  Bilateral wrist pain     Referring Provider (OT)  Karma Greaser, [place]    Onset Date/Surgical Date  09/07/19    Hand Dominance  Right    Prior Therapy  --   2017 for leg     Home  Environment   Lives With  Family      Prior Function   Leisure  has own studio teaching aerial aerobics ,       AROM   Overall AROM Comments  --   pain over FCU with sup,pron end range, flex, ext     Strength   Right Hand Grip (lbs)  78    Right Hand Lateral Pinch  18 lbs    Right Hand 3 Point Pinch  18 lbs    Left Hand Grip (lbs)  75    Left Hand Lateral Pinch  17 lbs    Left Hand 3 Point Pinch  16 lbs          Pt to wear wrist widgets during day with all activities and keep pain under 2/10  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 or do classes And AROM 1-2 x day full range -but stop prior to pain  And can do wrist ext and flexion stretches end range pain free               OT Education - 10/08/19 1604    Education Details  findings of eval and HEP    Person(s) Educated  Patient    Methods  Explanation;Demonstration;Tactile cues    Comprehension  Returned demonstration;Verbalized understanding          OT Long Term Goals - 10/08/19 1610      OT LONG TERM GOAL #1   Title  Pt to show pain free AROM at bilateral wrist to be able to iniate strengthening    Baseline  pain with last 20 degrees of pronation ,and 10 sup , wrist pain flexion ,ext pain with resistance end range    Time  3    Period  Weeks    Status  New    Target Date  10/29/19      OT LONG TERM GOAL #2   Title  Pt tolerate strengthening to bilateral wrist pain free to return to doing her workouts including flamingo tech    Baseline  pain with resistance end range for wrist flex, ext, sup , pron , grip , 3 point grip    Time  6    Period  Weeks    Target Date  11/19/19      OT LONG TERM GOAL #3   Title  Pt to be pain free using hands in ADL's and IADL"s without using wrist widget    Baseline  using  wrist widgets , and pain 6-9/10 with use certain tasks    Time  6    Status  New    Target Date  11/19/19            Plan - 10/08/19 1605    Clinical Impression Statement  Pt present at OT eval with onset of bilateral wrist pain L >R since about month ago - she do earial aerobics and has studio - started new techniques about month ago with momentum and force on wrist - pt pain can increase between 5-9/10 the last month and at rest 0/10 - pt do show some pain at end range sup , pron. Pain with resistance to wrist flexion ,ext over FCU - and when palm loaded in neutral - less pain with WB and no pain with palpation , wrist traction and joint mobs - pt to wear wrist widgets most all the time , keep pain under 2/10 - hold off on beat work ,and do ice - maintain pain free AROM and follow up in week    OT Occupational Profile and History  Problem Focused Assessment - Including review of records relating to presenting problem    Occupational performance deficits (Please refer to evaluation for details):  ADL's;IADL's;Work;Play;Leisure    Body Structure / Function / Physical Skills  IADL;Pain;Decreased knowledge of precautions;UE functional use;Strength    Rehab Potential  Good    Comorbidities Affecting Occupational Performance:  None    Modification or Assistance to Complete Evaluation   No modification of tasks or assist necessary to complete eval    OT Frequency  1x / week    OT Duration  6 weeks    OT Treatment/Interventions  Passive range of motion;Manual Therapy;Contrast Bath;Cryotherapy;Ultrasound;Fluidtherapy;Splinting;Patient/family education;Therapeutic exercise    Plan  assess progress iwth HEP    OT Home  Exercise Plan  see pt instruction    Consulted and Agree with Plan of Care  Patient       Patient will benefit from skilled therapeutic intervention in order to improve the following deficits and impairments:   Body Structure / Function / Physical Skills: IADL, Pain, Decreased  knowledge of precautions, UE functional use, Strength       Visit Diagnosis: Pain in left wrist - Plan: Ot plan of care cert/re-cert  Pain in right wrist - Plan: Ot plan of care cert/re-cert  Muscle weakness (generalized) - Plan: Ot plan of care cert/re-cert    Problem List There are no problems to display for this patient.   Rosalyn Gess OTR/L,CLT 10/08/2019, 4:19 PM  [place] [facility] [address], [place], [address] Phone: [phone]   Fax:  [phone]  Name: Tasha Chang MRN: [id number] Date of Birth: Apr 28, 1976

## 2019-10-15 ENCOUNTER — Ambulatory Visit: Payer: BC Managed Care – PPO | Admitting: Occupational Therapy

## 2019-10-15 ENCOUNTER — Other Ambulatory Visit: Payer: Self-pay

## 2019-10-15 DIAGNOSIS — M25531 Pain in right wrist: Secondary | ICD-10-CM

## 2019-10-15 DIAGNOSIS — M25532 Pain in left wrist: Secondary | ICD-10-CM

## 2019-10-15 DIAGNOSIS — M6281 Muscle weakness (generalized): Secondary | ICD-10-CM

## 2019-10-15 NOTE — Patient Instructions (Signed)
See note

## 2019-10-15 NOTE — Therapy (Signed)
[place] [facility] [address] [place], [address] Phone: [phone]   Fax:  [phone]  Occupational Therapy Treatment  Patient Details  Name: Tasha Chang MRN: [id number] Date of Birth: 10-Jul-1975 Referring Provider (OT): Margurite Auerbach   Encounter Date: 10/15/2019  OT End of Session - 10/15/19 1554    Visit Number  2    Number of Visits  6    Date for OT Re-Evaluation  11/19/19    OT Start Time  1450    OT Stop Time  1533    OT Time Calculation (min)  43 min    Activity Tolerance  Patient tolerated treatment well    Behavior During Therapy  [facility] for tasks assessed/performed       No past medical history on file.   There were no vitals filed for this visit.  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  Yes    Pain Score  3     Pain Location  Wrist    Pain Orientation  Right;Left    Pain Descriptors / Indicators  Aching;Shooting;Tightness    Pain Type  Acute pain    Aggravating Factors   sup, pronation end range       Pt report she was able to keep pain under 2/10 with use of wrist widgets during day and at teaching classes.  she could tell difference - also looking into alternatives fabric and power to give more friction to hold on better to fabric As well as  looking into changing her grip  Pain was less in RD, UD ,flexion and ext this date end range  Cont to have some pain L >R with sup ,pro end range - last 5 -10 degrees   Still no pain with compression , traction , joint mobs and rotation of wrists             OT Treatments/Exercises (OP) - 10/15/19 0001      RUE Fluidotherapy   Number Minutes Fluidotherapy  8 Minutes    RUE Fluidotherapy Location  Wrist    Comments  AROM in all planes pain free piror to reassessment of ROM and strenght       LUE Fluidotherapy   Number Minutes Fluidotherapy  8 Minutes    LUE Fluidotherapy Location  Wrist    Comments  in all planes pain free and prior to reassessment of pain in all planes       less pain with sup/pronation end range with resistance - could go further into 95 degrees - pt to use heat at home prior to sup /pro end range   Review and add isometric strengthening for flexion, ext,  RD, UD - 10-12 reps  3 days if pain free  Increase 2nd set -and another 3 days if pain free 3 sets  10-12 reps 2 x day   Wrist widget on the wrist that apply pressure - and no widget on wrist that is exercising ,       OT Education - 10/15/19 1554    Education Details  progress and upgrade on HEP    Person(s) Educated  Patient    Methods  Explanation;Demonstration;Tactile cues    Comprehension  Returned demonstration;Verbalized understanding          OT Long Term Goals - 10/08/19 1610      OT LONG TERM GOAL #1   Title  Pt to show pain free AROM at bilateral wrist to be able to iniate strengthening    Baseline  pain with last 20 degrees of pronation ,and 10 sup , wrist pain flexion ,ext pain with resistance end range    Time  3    Period  Weeks    Status  New    Target Date  10/29/19      OT LONG TERM GOAL #2   Title  Pt tolerate strengthening to bilateral wrist pain free to return to doing her workouts including flamingo tech    Baseline  pain with resistance end range for wrist  flex, ext, sup , pron , grip , 3 point grip    Time  6    Period  Weeks    Target Date  11/19/19      OT LONG TERM GOAL #3   Title  Pt to be pain free using hands in ADL's and IADL"s without using wrist widget    Baseline  using wrist widgets , and pain 6-9/10 with use certain tasks    Time  6    Status  New    Target Date  11/19/19            Plan - 10/15/19 1555    Clinical Impression Statement  Pt cont to show no pain with joint mobst, traction , rotation to wrist , pain on L with resistance to flexion ,ext less than 2/10 , last 5-10 degrees of sup/pro with resistance 3/10 but less after fluidotherapy- pt to cont with wrist widgets during day ,and add isometric for end range wrist flexion ,ext, RD, UD , but not for sup /pro = cont to work on sup /pro end range AROM pain free    OT Occupational Profile and History  Problem Focused Assessment - Including review of records relating to presenting problem    Occupational performance deficits (Please refer to evaluation for details):  ADL's;IADL's;Work;Play;Leisure    Body Structure / Function / Physical Skills  IADL;Pain;Decreased knowledge of precautions;UE functional use;Strength    Rehab Potential  Good    Clinical Decision Making  Several treatment options, min-mod task modification necessary    Comorbidities Affecting Occupational Performance:  None    Modification or Assistance to Complete Evaluation   No modification of tasks or assist necessary to complete eval    OT Frequency  1x / week    OT Duration  --   5 wks   OT Treatment/Interventions  Passive range of motion;Manual Therapy;Contrast Bath;Cryotherapy;Ultrasound;Fluidtherapy;Splinting;Patient/family education;Therapeutic exercise    Plan  assess progress iwth HEP    OT Home Exercise Plan  see pt instruction       Patient will benefit from skilled therapeutic intervention in order to improve the following  deficits and impairments:   Body Structure / Function /  Physical Skills: IADL, Pain, Decreased knowledge of precautions, UE functional use, Strength       Visit Diagnosis: Pain in left wrist  Pain in right wrist  Muscle weakness (generalized)    Problem List There are no problems to display for this patient.   Oletta Cohn OTR/l,CLT 10/15/2019, 3:58 PM  [facility] [facility] SPORTS MEDIC[facility] [address] [place], [address] Phone: [phone]   Fax:  [phone]  Name: Leith Hedlund MRN: [id number] Date of Birth: 12/06/1975

## 2019-10-22 ENCOUNTER — Ambulatory Visit: Payer: BC Managed Care – PPO | Admitting: Occupational Therapy

## 2019-10-22 ENCOUNTER — Other Ambulatory Visit: Payer: Self-pay

## 2019-10-22 DIAGNOSIS — M25532 Pain in left wrist: Secondary | ICD-10-CM

## 2019-10-22 DIAGNOSIS — M25531 Pain in right wrist: Secondary | ICD-10-CM

## 2019-10-22 DIAGNOSIS — M6281 Muscle weakness (generalized): Secondary | ICD-10-CM

## 2019-10-22 NOTE — Therapy (Signed)
[facility] [address] [place], [place], [address] Phone: [phone]   Fax:  [phone]  Occupational Therapy Treatment  Patient Details  Name: Tasha Chang MRN: [id number] Date of Birth: Oct 02, 1975 Referring Provider (OT): Meryl Crutch   Encounter Date: 10/22/2019  OT End of Session - 10/22/19 1617    Visit Number  3    Number of Visits  6    Date for OT Re-Evaluation  11/19/19    OT Start Time  1520    OT Stop Time  1605    OT Time Calculation (min)  45 min    Activity Tolerance  Patient tolerated treatment well    Behavior During Therapy  [facility] for tasks assessed/performed          Subjective Assessment - 10/22/19 1614    Subjective   Feels about the same - can get little further over - why can I go further over with my wrist if I add my shoulder - the heat do help    Pertinent History  Pain started about month ago - after starting to do some 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  she could tell difference - also looking into alternatives fabric and power to give more friction to hold on better to fabric As well as looking into changing her grip- more radial grip  No pain this date RD, UD  ,flexion and ext this date resistance Cont to have some pain L >R with sup ,pro end range with resistance - but can do 95 sup without pain - when adding shoulder with both - pain 1/10    Still no pain with compression , traction , joint mobs and rotation of wrists             OT Treatments/Exercises (OP) - 10/22/19 0001      RUE Fluidotherapy   Number Minutes Fluidotherapy  7 Minutes    RUE Fluidotherapy Location  Wrist    Comments  AROM in all planes for wrist       LUE Fluidotherapy   Number Minutes Fluidotherapy  7 Minutes    LUE Fluidotherapy Location  Wrist    Comments  AROm in all planes         less pain with sup/pronation end range after fluido - even  with resistance - could go further into 95 degrees and add shoulder  - pt to cont to  use heat at home prior to sup /pro end range , add shoulder to sup ,pronation -  simulating her aerial moves , and can pull with YTB at top of doorway achor - step away - if pain free by end of week with 2 x 12 - can add simulating of aerial move  Add and upgrade YTB for RD, UD, wrist extention  12 reps  3 days if pain free  Increase 2nd set -and another 3 days if pain free 3 sets  10-12 reps 2 x day       OT Education - 10/22/19 1616    Education Details  progress and upgrade on HEP    Person(s) Educated  Patient    Methods  Explanation;Demonstration;Tactile cues    Comprehension  Returned demonstration;Verbalized understanding          OT Long Term Goals - 10/08/19 1610      OT LONG TERM GOAL #1   Title  Pt to show pain free AROM at bilateral wrist to be able to iniate strengthening    Baseline  pain with last 20 degrees of pronation ,and 10 sup , wrist pain flexion ,ext pain with resistance end range    Time  3    Period  Weeks    Status  New    Target Date  10/29/19      OT LONG TERM GOAL #2   Title  Pt tolerate strengthening to bilateral wrist pain free to return to doing her workouts including flamingo  tech    Baseline  pain with resistance end range for wrist flex, ext, sup , pron , grip , 3 point grip    Time  6    Period  Weeks    Target Date  11/19/19      OT LONG TERM GOAL #3   Title  Pt to be pain free using hands in ADL's and IADL"s without using wrist widget    Baseline  using wrist widgets , and pain 6-9/10 with use certain tasks    Time  6    Status  New    Target Date  11/19/19            Plan - 10/22/19 1617    Clinical Impression Statement  Pt cont to show increase AROM with less pain - now only pain with resistance to pronation , sup end range L more than R - but after fluido less pain. Able to add resistance with YTB this date except for flexion - and some tightness with graston tool on ulnar and radial forearm    OT Occupational Profile and History  Problem Focused Assessment - Including review of records relating to presenting problem    Occupational performance deficits (Please refer to evaluation for details):  ADL's;IADL's;Work;Play;Leisure    Body Structure / Function / Physical Skills  IADL;Pain;Decreased knowledge of precautions;UE functional use;Strength    Rehab Potential  Good    Clinical Decision Making  Several treatment options, min-mod task modification necessary    Comorbidities Affecting Occupational Performance:  None    Modification or Assistance to Complete Evaluation   No modification of tasks or assist necessary to complete eval    OT Frequency  1x / week    OT Duration  4 weeks    OT Treatment/Interventions  Passive range of motion;Manual Therapy;Contrast Bath;Cryotherapy;Ultrasound;Fluidtherapy;Splinting;Patient/family education;Therapeutic exercise    Plan  assess progress iwth HEP    OT Home Exercise Plan  see pt instruction    Consulted and Agree with Plan of Care  Patient  Patient will benefit from skilled therapeutic intervention in order to improve the following deficits and impairments:   Body Structure / Function / Physical  Skills: IADL, Pain, Decreased knowledge of precautions, UE functional use, Strength       Visit Diagnosis: Pain in left wrist  Pain in right wrist  Muscle weakness (generalized)    Problem List There are no problems to display for this patient.   Rosalyn Gess OTR/l,CLT 10/22/2019, 4:19 PM  [place] [facility] [address] [place], [address] Phone: [phone]   Fax:  [phone]  Name: Chelbi Herber MRN: [id number] Date of Birth: 12-May-1976

## 2019-10-22 NOTE — Patient Instructions (Signed)
See note

## 2019-10-29 ENCOUNTER — Other Ambulatory Visit: Payer: Self-pay

## 2019-10-29 ENCOUNTER — Ambulatory Visit: Payer: BC Managed Care – PPO | Attending: Obstetrics & Gynecology | Admitting: Occupational Therapy

## 2019-10-29 DIAGNOSIS — M6281 Muscle weakness (generalized): Secondary | ICD-10-CM | POA: Diagnosis present

## 2019-10-29 DIAGNOSIS — M25532 Pain in left wrist: Secondary | ICD-10-CM | POA: Insufficient documentation

## 2019-10-29 DIAGNOSIS — M25531 Pain in right wrist: Secondary | ICD-10-CM

## 2019-10-29 NOTE — Therapy (Signed)
[facility] [address], [place], [address] Phone: [phone]   Fax:  [phone]  Occupational Therapy Treatment  Patient Details  Name: Tasha Chang MRN: [id number] Date of Birth: 24-Dec-1975 Referring Provider (OT): Meryl Crutch   Encounter Date: 10/29/2019  OT End of Session - 10/29/19 1511    Visit Number  4    Number of Visits  6    Date for OT Re-Evaluation  11/19/19    OT Start Time  0900    OT Stop Time  0944    OT Time Calculation (min)  44 min    Activity Tolerance  Patient tolerated treatment well    Behavior During Therapy  [facility] for tasks assessed/performed       No past medical history on file.  There were no vitals filed for this visit.  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 Wearing about 80% of the time. She can tell difference - also looking into alternatives fabric and power to give more friction to hold on better to fabric As well as looking into changing her grip- more radial grip  No pain this date RD, UD ,flexion and ext this date resistance Had no pain with supination and with adding external rotation  Cont to have some pain L >R with pro end range elbow to side and adding internal rotation- but can do 95 on both - but pain 1/10   Still no pain with compression , traction , joint mobs and rotation of wrists        OT Treatments/Exercises (OP) - 10/29/19 0001      RUE Fluidotherapy   Number Minutes Fluidotherapy  8 Minutes    RUE  Fluidotherapy Location  Wrist    Comments  AROM for wrist in all planes to decrease pain and stiffness       LUE Fluidotherapy   Number Minutes Fluidotherapy  8 Minutes    LUE Fluidotherapy Location  Wrist    Comments  AROM for wrist in all planes to decrease pain and stiffness       Simulated with her fabric -doing some of her aerial moves - including flamingo - had pain 3/10 with taking body weight, then 1-2/10 with using wrist widgets , and then after fluido therapy was 0-1/10     less pain with sup/pronation end range after fluido - even  with resistance - could go further into 95 degrees and add shoulder  - pt to cont to  use heat at home prior to sup /pro end range , add shoulder to sup ,pronation - simulating her aerial moves ,B upgrade to doing pull with RTB or GTB at top of doorway achor - step away - if pain free by end of week with 2 x 12 - can add simulating of aerial move  upgrade RTB for RD, UD, wrist extention  12 reps  3 days if pain free  Increase 2nd set -and another 3 days if pain free 3 sets  10-12 reps 2 x day   And add GTB for  mid and lower traps- 12-15 reps pain free  2 sets  And then increase to 3 sets in 3 days   Still use widgets to stay pain free during day        OT Education - 10/29/19 1511    Education Details  progress and upgrade on HEP    Person(s) Educated  Patient    Methods  Explanation;Demonstration;Tactile cues    Comprehension  Returned demonstration;Verbalized understanding          OT Long Term Goals - 10/08/19 1610      OT LONG TERM GOAL #1   Title  Pt to show pain free AROM at bilateral wrist to be able to iniate strengthening    Baseline  pain with last 20 degrees of pronation ,and 10 sup , wrist pain flexion ,ext pain with resistance end range    Time  3    Period  Weeks    Status  New    Target Date  10/29/19      OT LONG TERM GOAL #2   Title  Pt tolerate strengthening to bilateral wrist pain free to return to doing her workouts including flamingo tech    Baseline  pain with resistance end range for wrist flex, ext, sup , pron , grip , 3 point grip    Time  6    Period  Weeks    Target Date  11/19/19      OT LONG TERM GOAL #3   Title  Pt to be pain free using hands in ADL's and IADL"s without using wrist widget    Baseline  using wrist widgets , and pain 7-4/25 with use certain tasks    Time  6    Status  New    Target Date  11/19/19            Plan - 10/29/19 1511    Clinical Impression Statement  Pt cont to make progress in AROM with less pain - now only pain with end range pronation with internal rotation with shoulder - and then did increase with simulation on fabric to 3/10 -  but then with wrist widgets and then after fluido decrease to 1-0/10 - upgrade her HEP with increase resistance bands    OT Occupational Profile and History  Problem Focused Assessment - Including review of records relating to presenting problem    Occupational performance deficits (Please refer to evaluation for details):  ADL's;IADL's;Work;Play;Leisure    Body Structure / Function  / Physical Skills  IADL;Pain;Decreased knowledge of precautions;UE functional use;Strength    Rehab Potential  Good    Clinical Decision Making  Several treatment options, min-mod task modification necessary    Comorbidities Affecting Occupational Performance:  None    Modification or Assistance to Complete Evaluation   No modification of tasks or assist necessary to complete eval    OT Frequency  1x / week    OT Duration  4 weeks    OT Treatment/Interventions  Passive range of motion;Manual Therapy;Contrast Bath;Cryotherapy;Ultrasound;Fluidtherapy;Splinting;Patient/family education;Therapeutic exercise    Plan  assess progress iwth HEP    OT Home Exercise Plan  see pt instruction    Consulted and Agree with Plan of Care  Patient       Patient will benefit from skilled therapeutic intervention in order to improve the following deficits and impairments:   Body Structure / Function / Physical Skills: IADL, Pain, Decreased knowledge of precautions, UE functional use, Strength       Visit Diagnosis: Pain in left wrist  Pain in right wrist  Muscle weakness (generalized)    Problem List There are no problems to display for this patient.   Oletta Cohn OTR/l,CLT 10/29/2019, 4:14 PM  [place] [facility] PHYSICAL AND SPORTS MEDICINE [address] [place], [address] Phone: [phone]   Fax:  [phone]  Name: Tasha Chang MRN: [id number] Date of Birth: 1976-02-26

## 2019-10-29 NOTE — Patient Instructions (Signed)
See note

## 2019-11-05 ENCOUNTER — Ambulatory Visit: Payer: BC Managed Care – PPO | Admitting: Occupational Therapy

## 2019-11-05 ENCOUNTER — Other Ambulatory Visit: Payer: Self-pay

## 2019-11-05 DIAGNOSIS — M25532 Pain in left wrist: Secondary | ICD-10-CM

## 2019-11-05 DIAGNOSIS — M6281 Muscle weakness (generalized): Secondary | ICD-10-CM

## 2019-11-05 DIAGNOSIS — M25531 Pain in right wrist: Secondary | ICD-10-CM

## 2019-11-05 NOTE — Therapy (Signed)
[place] [facility] SPORTS MEDIC[facility] [address], [place], [address] Phone: [phone]   Fax:  [phone]  Occupational Therapy Treatment  Patient Details  Name: Tasha Chang MRN: [id number] Date of Birth: [birth year] Referring Provider (OT): Margurite Auerbach   Encounter Date: 11/05/2019  OT End of Session - 11/05/19 1611    Visit Number  5    Number of Visits  6    Date for OT Re-Evaluation  11/19/19    OT Start Time  1412    OT Stop Time  1505    OT Time Calculation (min)  53 min    Activity Tolerance  Patient tolerated treatment well    Behavior During Therapy  [facility] for tasks assessed/performed       No past medical history on file.   There were no vitals filed for this visit.  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  Yes    Pain Score  3     Pain Location  Wrist    Pain Orientation  Right;Left    Pain Descriptors / Indicators  Tightness;Aching    Aggravating Factors   all the way in pronation , intenal rotation with extended pinkie L more than R - and eagle arm stretch       Pt report she did use widget with Friday and Sat perfroming sessions - had no pain  But did not wear it at all yesterday and was little sore - did do biking   Before wearing it about 80%  of the time. She can tell difference - also looking into alternatives fabric and power to give more friction to hold on better to fabric As well as looking into changing her grip- more radial grip  No pain this dateRD, UD ,flexion and ext this date resistance Had no pain with supination and with adding external rotation  Cont to have some pain L >R with pro end range elbow to side and adding internal rotation- but can do 95 on both - but pain 1/10 Eagle stretch was before every easy for her - got 50% and had pain 4-5/10    Still no pain with compression , traction , joint mobs and rotation             OT Treatments/Exercises (OP) - 11/05/19 0001      RUE Fluidotherapy   Number Minutes Fluidotherapy  8 Minutes    RUE Fluidotherapy Location  Wrist    Comments  decrease pain with AROM afterwards       LUE Fluidotherapy   Number Minutes Fluidotherapy  8 Minutes    LUE Fluidotherapy Location  Wrist  Comments  decrease pain afterwards in AROM      Done some graston tool sweeping and brushing on volar wrist and forearm -and was tight ulnar side - pt to cont to massage - and do heat prior to stetches    Simulated with thick dowel  -doing  Flamingo grip - and OT pull some of her body weight - no pain  - after fluido therapy was 0-1/10     less pain with sup/pronation end rangeafter fluido - evenwith resistance - could go further into 95 degrees and add shoulder- pt to cont touse heat at home prior to sup /pro end range , add shoulder to sup ,pronation - simulating her aerial moves ,B upgrade to doing pull with RTB or GTB at top of doorway achor - step away - if pain free by end of week with 2 x 12 - can add simulating of aerial move  Eagle stretch she could do 3/4 of the range before pain increase- will assess next time again   HEP kept the same -because of her performing 2 nights she did not completer her HEP   HEP  RTB for RD, UD, wrist extention12 reps  3  days if pain free  Increase 2nd set -and another 3 days if pain free 3 sets  10-12 reps 2 x day  And add GTB for mid and lower traps- 12-15 reps pain free  2 sets  And then increase to 3 sets in 3 days   Still use widgets to stay pain free during day - 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         OT Education - 11/05/19 1611    Education Details  progress and upgrade on HEP    Person(s) Educated  Patient    Methods  Explanation;Demonstration;Tactile cues    Comprehension  Returned demonstration;Verbalized understanding          OT Long Term Goals - 10/08/19 1610      OT LONG TERM GOAL #1   Title  Pt to show pain free AROM at bilateral wrist to be able to iniate strengthening    Baseline  pain with last 20 degrees of pronation ,and 10 sup , wrist pain flexion ,ext pain with resistance end range    Time  3    Period  Weeks    Status  New    Target Date  10/29/19      OT LONG TERM GOAL #2   Title  Pt tolerate strengthening to bilateral wrist pain free to return to doing her workouts including flamingo tech    Baseline  pain with resistance end range for wrist flex, ext, sup , pron , grip , 3 point grip    Time  6    Period  Weeks    Target Date  11/19/19      OT LONG TERM GOAL #3   Title  Pt to be pain free using hands in ADL's and IADL"s without using wrist widget    Baseline  using wrist widgets , and pain 6-9/10 with use certain tasks    Time  6    Status  New    Target Date  11/19/19            Plan - 11/05/19 1612    Clinical Impression Statement  Pt done soe performing this past weekend and did not had any pain - did not wear widgets yesterday - no pain - with heat  and soft tissue pain decrease with end range and eagle stretch could go further    OT Occupational Profile and History  Problem Focused Assessment - Including review of records relating to presenting problem    Occupational performance deficits (Please refer to evaluation for  details):  ADL's;IADL's;Work;Play;Leisure    Body Structure / Function / Physical Skills  IADL;Pain;Decreased knowledge of precautions;UE functional use;Strength    Rehab Potential  Good    Clinical Decision Making  Several treatment options, min-mod task modification necessary    Comorbidities Affecting Occupational Performance:  None    Modification or Assistance to Complete Evaluation   No modification of tasks or assist necessary to complete eval    OT Frequency  1x / week    OT Duration  4 weeks    OT Treatment/Interventions  Passive range of motion;Manual Therapy;Contrast Bath;Cryotherapy;Ultrasound;Fluidtherapy;Splinting;Patient/family education;Therapeutic exercise    Plan  assess progress iwth HEP    OT Home Exercise Plan  see pt instruction    Consulted and Agree with Plan of Care  Patient       Patient will benefit from skilled therapeutic intervention in order to improve the following deficits and impairments:   Body Structure / Function / Physical Skills: IADL, Pain, Decreased knowledge of precautions, UE functional use, Strength       Visit Diagnosis: Pain in left wrist  Pain in right wrist  Muscle weakness (generalized)    Problem List There are no problems to display for this patient.   Oletta Cohn OTR/l,CLT 11/05/2019, 4:14 PM  [facility] [place] [facility] PHYSICAL AND SPORTS MEDICINE [address], [place], [address] Phone: [phone]   Fax:  [phone]  Name: Jillianna Stanek MRN: [id number] Date of Birth: Feb 09, 1976

## 2019-11-05 NOTE — Patient Instructions (Signed)
Same HEP - but wean 50% widgets wearing

## 2019-11-12 ENCOUNTER — Ambulatory Visit: Payer: BC Managed Care – PPO | Admitting: Occupational Therapy

## 2019-11-19 ENCOUNTER — Ambulatory Visit: Payer: BC Managed Care – PPO | Admitting: Occupational Therapy

## 2019-11-23 ENCOUNTER — Ambulatory Visit: Payer: BC Managed Care – PPO | Admitting: Occupational Therapy

## 2019-11-23 ENCOUNTER — Other Ambulatory Visit: Payer: Self-pay

## 2019-11-23 DIAGNOSIS — M6281 Muscle weakness (generalized): Secondary | ICD-10-CM

## 2019-11-23 DIAGNOSIS — M25531 Pain in right wrist: Secondary | ICD-10-CM

## 2019-11-23 DIAGNOSIS — M25532 Pain in left wrist: Secondary | ICD-10-CM

## 2019-11-23 NOTE — Therapy (Signed)
[place] [facility] PHYS[facility] SPORTS MEDICINE [address] [place], [address] Phone: [phone]   Fax:  [phone]  Occupational Therapy Evaluation  Patient Details  Name: Tasha Chang MRN: [id number] Date of Birth: March 25, 1976 Referring Provider (OT): Meryl Crutch   Encounter Date: 11/23/2019  OT End of Session - 11/23/19 1819    Visit Number  6    Number of Visits  10    Date for OT Re-Evaluation  01/18/20    OT Start Time  1045    OT Stop Time  1132    OT Time Calculation (min)  47 min    Activity Tolerance  Patient tolerated treatment well    Behavior During Therapy  [facility] for tasks assessed/performed       No past medical history on file.    There were no vitals filed for this visit.  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 -and outside of that not and did not had pain - wear less than 50% of time  And done yoga normally without widgets no pain  Could do her aerial work without pain gripping narrow and single fabric - did not try thicker grip    She cantell difference - also looking into alternatives fabric and power to give more friction to hold on better to fabric As well as looking into changing her grip- more radial grip  No pain this dateRD, UD ,flexion but 2/10with  ext this date resistance Had pain end range sup and pronation - 2/10 Eagle stretch assess- done 5- was before every easy for her - got 50% to 60%  of stretch with R hand 3-4/10 pain if on tops and L hand 3/10 - at wrist  After fluido , soft tissue and carpal rolls this date - R hand was 75% 2/10 and L 3/10 but 75% of stretch     OT Treatments/Exercises (OP) - 11/23/19 0001      Ultrasound   Ultrasound Location  L FCU    Ultrasound Parameters  3. , 20%, 1.0 intensity     Ultrasound Goals  Pain      RUE Fluidotherapy   Number Minutes Fluidotherapy  8 Minutes    RUE Fluidotherapy Location  Wrist    Comments  decrease pain and increase AROM       LUE Fluidotherapy   Number Minutes Fluidotherapy  8 Minutes    LUE Fluidotherapy Location  Wrist    Comments  decrease pain and increase ROM        Done some graston tool sweeping and brushing on volar wrist and forearm -and was tight ulnar side L worse than R  - pt to cont to massage - and do heat prior to stetches Done this date carpal rolls in weight bearing position - and add to HEP     Simulated with thick dowel  -doing  Flamingo grip - and OT pull some of her body weight - no pain - bilateral but did more radial grip on L   increase pain with sup/pronation  end rangethis datewith resistance- pt to cont touse heat at home prior to sup /pro end range , add shoulder to sup ,pronation - simulating her aerial moves ,B upgrade to doing pull with RTB or GTBat top of doorway achor - step away - if pain free by end of week with 2 x 12 - can add simulating of aerial move  Eagle stretch she could do 3/4 of the range before pain increase- will assess next time again   HEP kept the same -because of her performing 2 nights she did not completer her HEP   HEP RTB for RD, UD, wrist extention12 reps  3 days if pain free  Increase 2nd set -and another 3 days if pain free 3 sets  10-12 reps 2 x day  And add GTB for mid and lower traps- 12-15 reps pain free  2 sets  And then increase to 3 sets in 3 days   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         OT Education - 11/23/19 1819    Education Details  progress and upgrade on HEP    Person(s) Educated  Patient    Methods  Explanation;Demonstration;Tactile cues    Comprehension  Returned demonstration;Verbalized understanding          OT Long Term Goals - 11/23/19 1823      OT LONG TERM GOAL #1   Title  Pt to show pain free AROM at bilateral wrist to be able to iniate strengthening    Baseline  Pt only pain with end range of sup, pro  2/10 -but able to do strengthening except for wrist flexion    Time  4    Period  Weeks    Status  On-going    Target Date  12/21/19      OT LONG TERM GOAL #2   Title  Pt tolerate strengthening to bilateral wrist pain free to return to doing her workouts including flamingo tech    Baseline  tolerate strenghtening well -but  pain with end range sup ,pron and 3 point grip - can do now flamingo tech but use widgets    Time  6    Period  Weeks    Status  On-going    Target Date  01/04/20      OT LONG TERM GOAL #3   Title  Pt to be pain free using hands in ADL's and IADL"s without using wrist widget    Baseline  no widgets  with ADL's and IADL's - no pain - widgets on for aerial classes    Status  Achieved      OT LONG TERM GOAL #4   Title  bilateral AROM in wrist increase for pt to be able to do eagle stretch pain free    Baseline  50%  pain 3-4/10 bilateral but R worse - prior to fluido and soft tissue -and  DURING SESSION 75% of stretch with pain 3-4/10 on L ,and 2/10 on R    Time  8    Period  Weeks    Status  New    Target Date  01/18/20            Plan - 11/23/19 1822    Clinical Impression Statement  Pt show increase bilateral grip strength , decrease wearing of wrist widgets, able to do yoga without them and had no increase pain -but still pain with end range sup ,pron - and eagle stretch - done some carpal rolls this date and add to HEP , with graston and heat - use widgets with aeriel classes    OT Occupational Profile and History  Problem Focused Assessment - Including review of records relating to presenting problem    Occupational performance deficits (Please refer to evaluation for details):  ADL's;IADL's;Work;Play;Leisure    Body Structure / Function / Physical Skills  IADL;Pain;Decreased knowledge of precautions;UE functional use;Strength    Rehab Potential  Good    Clinical Decision Making  Several treatment options, min-mod task modification necessary    Comorbidities Affecting Occupational Performance:  None    Modification or Assistance to Complete Evaluation   No modification of tasks or assist necessary to complete eval    OT Frequency  Biweekly    OT Duration  8 weeks    OT Treatment/Interventions  Passive range of motion;Manual Therapy;Contrast Bath;Cryotherapy;Ultrasound;Fluidtherapy;Splinting;Patient/family education;Therapeutic exercise    Plan  assess progress iwth HEP    Consulted and Agree with Plan of Care  Patient       Patient will benefit from skilled therapeutic intervention in order to improve the following deficits and impairments:   Body Structure / Function /  Physical Skills: IADL, Pain, Decreased knowledge of precautions, UE functional use, Strength       Visit Diagnosis: Pain in left wrist - Plan: Ot plan of care cert/re-cert  Pain in right wrist - Plan: Ot plan of care cert/re-cert  Muscle weakness (generalized) - Plan: Ot plan of care cert/re-cert    Problem List There are no problems to display for this patient.   Rosalyn Gess  OTR/L,CLT 11/23/2019, 6:32 PM  [place] PHYSICAL AND SPORTS MEDICINE [address] [place], [address] Phone: [phone]   Fax:  [phone]  Name: Tasha Chang MRN: [id number] Date of Birth: March 07, 1976

## 2019-11-23 NOTE — Patient Instructions (Signed)
See note

## 2019-12-10 ENCOUNTER — Ambulatory Visit: Payer: BC Managed Care – PPO | Attending: Obstetrics & Gynecology | Admitting: Occupational Therapy

## 2019-12-27 ENCOUNTER — Ambulatory Visit: Payer: BC Managed Care – PPO | Attending: Obstetrics & Gynecology | Admitting: Occupational Therapy

## 2019-12-27 ENCOUNTER — Other Ambulatory Visit: Payer: Self-pay

## 2019-12-27 DIAGNOSIS — M6281 Muscle weakness (generalized): Secondary | ICD-10-CM | POA: Diagnosis present

## 2019-12-27 DIAGNOSIS — M25531 Pain in right wrist: Secondary | ICD-10-CM

## 2019-12-27 DIAGNOSIS — M25532 Pain in left wrist: Secondary | ICD-10-CM

## 2019-12-27 NOTE — Patient Instructions (Signed)
See note

## 2019-12-27 NOTE — Therapy (Signed)
[place] [facility] [address], [place], [address] Phone: [phone]   Fax:  [phone]  Occupational Therapy Treatment  Patient Details  Name: Tasha Chang MRN: [id number] Date of Birth: Oct 04, 1975 Referring Provider (OT): Meryl Crutch   Encounter Date: 12/27/2019   OT End of Session - 12/27/19 1248    Visit Number 7    Number of Visits 10    Date for OT Re-Evaluation 01/18/20    OT Start Time 1010    OT Stop Time 1056    OT Time Calculation (min) 46 min    Behavior During Therapy [facility] for tasks assessed/performed           No past medical history on file.    There were no vitals filed for this visit.   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  And her performance this past weekend  With her yoga and  outside of that not and did not had pain - and not wearing widgets And done yoga normally without widgets no pain  Could do her aerial work without pain gripping double fabric and doing her flamingo grip too without pain     She cantell difference with her perfomance - but notice her grip is weaker on the L - she had to lock herself into position - and could not grip - had to do wrist extention when she was practicing for her perfomance and since then some L elbow pain  -  looking into alternatives fabric and power to give more friction to hold on better to fabric As well as looking into changing her grip- more  radial grip  No pain this dateRD, UD , and extention - flexion but 1/10 ulnar wrist  Had pain end range sup and pronation - 1/10 Eagle stretch assess- done 5- was before every easy for her - got 75% no pain but beyong R hand 3-4/10 pain if on top and L hand 5-6/10 - at ulnar wrist  After fluido , soft tissue and carpal rolls this date - R hand was 100% with pain about 1-2/10 and 100% stretch L 3/10    Tenderness over lateral epicondyle L - pain with resistance wrist extention 4/10  Grip decrease    Done some graston tool sweeping and brushing on volar wrist and forearm, dorsal L forearm to epicondyle- and cross friction massage -and pt to do at home  Can do stretch for forearm extensors - hand in loose fist - palm down and neutral - 5 hole 5 reps  And ice massage  Avoid tight grip , wrist extention  Pick up or carry objects with palm up   Done this date carpal rolls in weight bearing position - and add to HEP    Simulated withthick dowel-doing Flamingo grip - and OT pull some of her body weight - no pain- bilateral but did more  radial grip on L    HOLD off on theraband HEP   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 and doing her performance Cont with heat , soft tissues, carpal rolls , stretches            OT Treatments/Exercises (OP) - 12/27/19 0001      RUE Fluidotherapy   Number Minutes Fluidotherapy 8 Minutes    RUE Fluidotherapy Location Wrist    Comments decrease pain and stiffness       LUE Fluidotherapy   Number Minutes Fluidotherapy 8 Minutes    LUE Fluidotherapy Location Wrist    Comments decrease pain and stiffness                   OT Education - 12/27/19 1247    Education Details changes to HEP    Person(s) Educated Patient    Methods Explanation;Demonstration;Tactile cues    Comprehension Returned demonstration;Verbalized understanding               OT Long Term Goals - 11/23/19 1823      OT LONG TERM GOAL #1   Title Pt to show pain free AROM at bilateral wrist to be able to iniate strengthening    Baseline Pt only pain with end range of sup, pro  2/10 -but able to do strengthening except for wrist flexion    Time 4    Period Weeks    Status On-going    Target Date 12/21/19      OT LONG TERM GOAL #2   Title Pt tolerate strengthening to bilateral wrist pain free to return to doing her workouts including flamingo tech    Baseline tolerate strenghtening well -but pain with end range sup ,pron and 3 point grip - 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, mining but use widgets    Time 6    Period Weeks    Status On-going    Target Date 01/04/20      OT LONG TERM GOAL #3   Title Pt to be pain free using hands in ADL's and IADL"s without using wrist widget    Baseline no widgets with ADL's and IADL's - no pain - widgets on for [employer]  Status Achieved      OT LONG TERM GOAL #4   Title bilateral AROM in wrist increase for pt to be able to do eagle stretch pain free    Baseline 50%  pain 3-4/10 bilateral but R worse - prior to fluido and soft tissue  -and  DURING SESSION 75% of stretch with pain 3-4/10 on L ,and 2/10 on R    Time 8    Period Weeks    Status New    Target Date 01/18/20                 Plan - 12/27/19 1248    Clinical Impression Statement Pt was not seen for about month - had one performance , 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 No issues with daily act or yoga - no widgets- flamingo grip and pain better and using thick cloth grip -Eagle stretch she can do not the while stretch but pain 5/10 on L , R 3/10 last 1/2 of the stretch on ulnar wrist - this ate do show some lateral epicondylitis pain on L - grip decrease 20 lbs ,and pain with wrist extention - HEP modify for pt -and ed on modifications with her performance and classess    OT Occupational Profile and History Problem Focused Assessment - Including review of records relating to presenting problem    Occupational performance deficits (Please refer to evaluation for details): ADL's;IADL's;Work;Play;Leisure    Body Structure / Function / Physical Skills IADL;Pain;Decreased knowledge of precautions;UE functional use;Strength    Rehab Potential Good    Clinical Decision Making Several treatment options, min-mod task modification necessary    Comorbidities Affecting Occupational Performance: None    Modification or Assistance to Complete Evaluation  No modification of tasks or assist necessary to complete eval    OT Frequency 1x / week    OT Duration 4 weeks    OT Treatment/Interventions Passive range of motion;Manual Therapy;Contrast Bath;Cryotherapy;Ultrasound;Fluidtherapy;Splinting;Patient/family education;Therapeutic exercise    Plan next week check - pt has 4 x 10 min performance this weekend    OT Home Exercise Plan see pt instruction    Consulted and Agree with Plan of Care Patient           Patient will benefit from skilled therapeutic intervention in order to improve the following deficits and impairments:   Body Structure / Function /  Physical Skills: IADL, Pain, Decreased knowledge of precautions, UE functional use, Strength       Visit Diagnosis: Pain in left wrist  Pain in right wrist  Muscle weakness (generalized)    Problem List There are no problems to display for this patient.   Oletta Cohn  OTR/l,CLT  12/27/2019, 12:55 PM  [facility] SPORTS MEDICINE [address] [place], [address] Phone: [phone]   Fax:  [phone]  Name: Tasha Chang MRN: [id number] Date of Birth: 23-Jan-1976

## 2020-01-03 ENCOUNTER — Ambulatory Visit: Payer: BC Managed Care – PPO | Admitting: Occupational Therapy

## 2020-01-03 ENCOUNTER — Other Ambulatory Visit: Payer: Self-pay

## 2020-01-03 DIAGNOSIS — M25531 Pain in right wrist: Secondary | ICD-10-CM

## 2020-01-03 DIAGNOSIS — M25532 Pain in left wrist: Secondary | ICD-10-CM | POA: Diagnosis not present

## 2020-01-03 DIAGNOSIS — M6281 Muscle weakness (generalized): Secondary | ICD-10-CM

## 2020-01-03 NOTE — Therapy (Signed)
[place] [facility] [address] [place], [address] Phone: [phone]   Fax:  [phone]  Occupational Therapy discharge  Patient Details  Name: Tasha Chang MRN: [id number] Date of Birth: August 28, 1975 Referring Provider (OT): Margurite Auerbach   Encounter Date: 01/03/2020   OT End of Session - 01/03/20 1155    Visit Number 8    Number of Visits 8    Date for OT Re-Evaluation 01/03/20    OT Start Time 1015    OT Stop Time 1030    OT Time Calculation (min) 15 min    Activity Tolerance Patient tolerated treatment well    Behavior During Therapy [facility] for tasks assessed/performed           No past medical history on file.    There were no vitals filed for this visit.   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  Pt report she is paying out of pocket - and cannot come anymore - feels she is better - range of motion and pain   but still present  Pt has no pain with joint mobs , traction , compression bilateral wrists  Pt eagle stretches - she can do 75% - but L hand pain 1-2/10  And R 3-5/10 over last 25%    Discuss with pt her sport and art - she needs perfect ROM and strength - work mostly in her end range of joint , loading and gripping  Did also in past tried some pounding -  Discuss with her she needs to 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 ,  yoga and then performing too -she needs to assess what she wants to do long term  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  she needs to look at doing variety and not same things teaching or advance classes  Think about seeing ortho -for MRI - to make sure no partial tears at wrist  Her grip still decrease since last week when she had some lateral epicondylitis  Pain and tenderness better this date - 1/10 -last week was 4/10  She needs to pay attention if pain lingers more than 2 hrs or more next day - look back 12-24 hrs before what she done  She need to look into using different fabric or using something to increase or ease of grip -   Cont with her HEP for contrast , soft tissue mobs , stretches                         OT Long Term Goals - 01/03/20 1158      OT LONG TERM GOAL #1   Title Pt to show pain free AROM at bilateral wrist to be able to iniate strengthening    Baseline no pain AROM and end range -done strenghtening    Status Achieved      OT LONG TERM GOAL #2   Title Pt tolerate strengthening to bilateral wrist  pain free to return to doing her workouts including flamingo tech    Baseline tolerate strengthening - and able to do flamingo tech with no pain    Status Achieved      OT LONG TERM GOAL #3   Title Pt to be pain free using hands in ADL's and IADL"s without using wrist widget    Baseline widgets only on with performing - but did not wear that even this past weekend    Status Achieved      OT LONG TERM GOAL #4   Title bilateral AROM in wrist increase for pt to be able to do eagle stretch pain free    Baseline 75% of stretch but R still 5/10 , L 2-3/10    Status Not Met                 Plan - 01/03/20 1200    Clinical Impression Statement Pt was seen for 8 visits and pain decrease at rest , performing every day activities , yoga without pain and use of wrist widgets - Did do this weekend performance without widget- had some sornness in ulnar side of hand - but was probably over gripping during performance -had some lateral epicondyle symptoms last time but improve - pt pay out of pocket - recommend for pt to cont with HEP, 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 , yoga and perfroming - and maybe do see orthopedics for MRI to rule out any partial tears - that way she rules out that component    OT Occupational Profile and History Problem Focused Assessment - Including review of records relating to presenting problem    Occupational performance deficits (Please refer to evaluation for details): ADL's;IADL's;Work;Play;Leisure    Body Structure / Function / Physical Skills IADL;Pain;Decreased knowledge of precautions;UE functional use;Strength    Rehab Potential Good    Clinical Decision Making Several treatment options, min-mod task modification necessary    Comorbidities Affecting Occupational Performance: None    Modification or Assistance to Complete Evaluation  No modification of tasks or assist necessary to complete  eval    OT Treatment/Interventions Passive range of motion;Manual  Therapy;Contrast Bath;Cryotherapy;Ultrasound;Fluidtherapy;Splinting;Patient/family education;Therapeutic exercise    OT Home Exercise Plan see pt instruction    Consulted and Agree with Plan of Care Patient           Patient will benefit from skilled therapeutic intervention in order to improve the following deficits and impairments:   Body Structure / Function / Physical Skills: IADL, Pain, Decreased knowledge of precautions, UE functional use, Strength       Visit Diagnosis: Pain in left wrist  Pain in right wrist  Muscle weakness (generalized)    Problem List There are no problems to display for this patient.   Rosalyn Gess OTR/L,CLT 01/03/2020, 12:28 PM  [place] [facility] [address] [place], [address] Phone: [phone]   Fax:  [phone]  Name: Tasha Chang MRN: [id number] Date of Birth: 1976-05-29

## 2020-09-30 ENCOUNTER — Other Ambulatory Visit: Payer: Self-pay | Admitting: Obstetrics & Gynecology

## 2020-09-30 DIAGNOSIS — E559 Vitamin D deficiency, unspecified: Secondary | ICD-10-CM | POA: Diagnosis not present

## 2020-09-30 DIAGNOSIS — Z1231 Encounter for screening mammogram for malignant neoplasm of breast: Secondary | ICD-10-CM | POA: Diagnosis not present

## 2020-09-30 DIAGNOSIS — Z1151 Encounter for screening for human papillomavirus (HPV): Secondary | ICD-10-CM | POA: Diagnosis not present

## 2020-09-30 DIAGNOSIS — Z01419 Encounter for gynecological examination (general) (routine) without abnormal findings: Secondary | ICD-10-CM | POA: Diagnosis not present

## 2020-09-30 DIAGNOSIS — Z124 Encounter for screening for malignant neoplasm of cervix: Secondary | ICD-10-CM | POA: Diagnosis not present

## 2020-09-30 LAB — HM PAP SMEAR

## 2020-10-16 ENCOUNTER — Ambulatory Visit
Admission: RE | Admit: 2020-10-16 | Discharge: 2020-10-16 | Disposition: A | Discharge status: Home / Self Care | Payer: BC Managed Care – PPO | Source: Ambulatory Visit | Attending: Obstetrics & Gynecology | Admitting: Obstetrics & Gynecology

## 2020-10-16 ENCOUNTER — Other Ambulatory Visit: Payer: Self-pay

## 2020-10-16 DIAGNOSIS — Z1231 Encounter for screening mammogram for malignant neoplasm of breast: Secondary | ICD-10-CM

## 2020-10-22 ENCOUNTER — Other Ambulatory Visit: Payer: Self-pay | Admitting: Obstetrics & Gynecology

## 2020-10-22 DIAGNOSIS — R928 Other abnormal and inconclusive findings on diagnostic imaging of breast: Secondary | ICD-10-CM

## 2020-10-22 DIAGNOSIS — N6489 Other specified disorders of breast: Secondary | ICD-10-CM

## 2020-10-24 ENCOUNTER — Other Ambulatory Visit: Payer: Self-pay

## 2020-10-24 ENCOUNTER — Ambulatory Visit
Admission: RE | Admit: 2020-10-24 | Discharge: 2020-10-24 | Disposition: A | Discharge status: Home / Self Care | Payer: BC Managed Care – PPO | Source: Ambulatory Visit | Attending: Obstetrics & Gynecology | Admitting: Obstetrics & Gynecology

## 2020-10-24 DIAGNOSIS — N6489 Other specified disorders of breast: Secondary | ICD-10-CM

## 2020-10-24 DIAGNOSIS — R928 Other abnormal and inconclusive findings on diagnostic imaging of breast: Secondary | ICD-10-CM

## 2021-03-18 ENCOUNTER — Ambulatory Visit: Payer: Self-pay

## 2021-03-18 ENCOUNTER — Other Ambulatory Visit: Payer: Self-pay

## 2021-03-18 DIAGNOSIS — Z23 Encounter for immunization: Secondary | ICD-10-CM

## 2021-03-20 DIAGNOSIS — Z23 Encounter for immunization: Secondary | ICD-10-CM | POA: Diagnosis not present

## 2021-07-13 DIAGNOSIS — Z87892 Personal history of anaphylaxis: Secondary | ICD-10-CM | POA: Diagnosis not present

## 2021-07-13 DIAGNOSIS — Z1231 Encounter for screening mammogram for malignant neoplasm of breast: Secondary | ICD-10-CM | POA: Diagnosis not present

## 2021-08-03 DIAGNOSIS — D2261 Melanocytic nevi of right upper limb, including shoulder: Secondary | ICD-10-CM | POA: Diagnosis not present

## 2021-08-03 DIAGNOSIS — D2272 Melanocytic nevi of left lower limb, including hip: Secondary | ICD-10-CM | POA: Diagnosis not present

## 2021-08-03 DIAGNOSIS — D2262 Melanocytic nevi of left upper limb, including shoulder: Secondary | ICD-10-CM | POA: Diagnosis not present

## 2021-08-03 DIAGNOSIS — D225 Melanocytic nevi of trunk: Secondary | ICD-10-CM | POA: Diagnosis not present

## 2021-12-09 ENCOUNTER — Other Ambulatory Visit: Payer: Self-pay | Admitting: Obstetrics and Gynecology

## 2021-12-09 ENCOUNTER — Other Ambulatory Visit: Payer: Self-pay | Admitting: Obstetrics & Gynecology

## 2021-12-09 DIAGNOSIS — Z1231 Encounter for screening mammogram for malignant neoplasm of breast: Secondary | ICD-10-CM

## 2021-12-15 ENCOUNTER — Encounter: Payer: Self-pay | Admitting: Radiology

## 2021-12-15 ENCOUNTER — Ambulatory Visit
Admission: RE | Admit: 2021-12-15 | Discharge: 2021-12-15 | Disposition: A | Discharge status: Home / Self Care | Payer: BC Managed Care – PPO | Source: Ambulatory Visit | Attending: Obstetrics and Gynecology | Admitting: Obstetrics and Gynecology

## 2021-12-15 DIAGNOSIS — Z1231 Encounter for screening mammogram for malignant neoplasm of breast: Secondary | ICD-10-CM | POA: Diagnosis not present

## 2022-03-17 ENCOUNTER — Encounter: Payer: Self-pay | Admitting: Family Medicine

## 2022-03-17 ENCOUNTER — Ambulatory Visit (INDEPENDENT_AMBULATORY_CARE_PROVIDER_SITE_OTHER): Payer: BC Managed Care – PPO | Admitting: Family Medicine

## 2022-03-17 VITALS — BP 122/74 | HR 60 | Temp 98.2°F | Ht 66.5 in | Wt 120.3 lb

## 2022-03-17 DIAGNOSIS — F4321 Adjustment disorder with depressed mood: Secondary | ICD-10-CM | POA: Diagnosis not present

## 2022-03-17 DIAGNOSIS — Z23 Encounter for immunization: Secondary | ICD-10-CM | POA: Diagnosis not present

## 2022-03-17 DIAGNOSIS — E559 Vitamin D deficiency, unspecified: Secondary | ICD-10-CM | POA: Diagnosis not present

## 2022-03-17 DIAGNOSIS — E538 Deficiency of other specified B group vitamins: Secondary | ICD-10-CM | POA: Insufficient documentation

## 2022-03-17 DIAGNOSIS — E041 Nontoxic single thyroid nodule: Secondary | ICD-10-CM | POA: Insufficient documentation

## 2022-03-17 DIAGNOSIS — Z634 Disappearance and death of family member: Secondary | ICD-10-CM

## 2022-03-17 DIAGNOSIS — Z1211 Encounter for screening for malignant neoplasm of colon: Secondary | ICD-10-CM

## 2022-03-17 DIAGNOSIS — Z0000 Encounter for general adult medical examination without abnormal findings: Secondary | ICD-10-CM | POA: Diagnosis not present

## 2022-03-17 LAB — URINALYSIS, ROUTINE W REFLEX MICROSCOPIC
Bilirubin, UA: NEGATIVE
Glucose, UA: NEGATIVE
Ketones, UA: NEGATIVE
Leukocytes,UA: NEGATIVE
Nitrite, UA: NEGATIVE
Protein,UA: NEGATIVE
RBC, UA: NEGATIVE
Specific Gravity, UA: >1.03 — ABNORMAL HIGH (ref 1.005–1.030)
Urobilinogen, Ur: 0.2 mg/dL (ref 0.2–1.0)
pH, UA: 6 (ref 5.0–7.5)

## 2022-03-17 MED ORDER — EPINEPHRINE 0.3 MG/0.3ML IJ SOAJ: 0.3000 mg | INTRAMUSCULAR | 12 refills | Status: DC | PRN

## 2022-03-17 NOTE — Assessment & Plan Note (Signed)
Doing OK right now- working with grief counseling. Will call with any concerns.

## 2022-03-17 NOTE — Assessment & Plan Note (Signed)
Labs drawn today. Has had [place] and Bx in the past. Continue to monitor.

## 2022-03-17 NOTE — Assessment & Plan Note (Signed)
Rechecking labs today. Await results.  

## 2022-03-17 NOTE — Progress Notes (Signed)
BP 122/74   Pulse 60   Temp 98.2 F (36.8 C)   Ht 5' 6.5" (1.689 m)   Wt 120 lb 4.8 oz (54.6 kg)   LMP 02/24/2022 (Exact Date)   SpO2 100%   BMI 19.13 kg/m    Subjective:    Patient ID: Tasha Chang, female    DOB: Aug 19, 1975, 46 y.o.   MRN: [id number]  HPI: Tasha Chang is a 46 y.o. female presenting on 03/17/2022 to establish care and for comprehensive medical examination. Current medical complaints include:none  She currently lives with: husband Menopausal Symptoms: no  Depression Screen done today and results listed below:     03/17/2022   11:29 AM  Depression screen PHQ 2/9  Decreased Interest 1  Down, Depressed, Hopeless 2  PHQ - 2 Score 3  Altered sleeping 1  Tired, decreased energy 0  Change in appetite 0  Feeling bad or failure about yourself  2  Trouble concentrating 1  Moving slowly or fidgety/restless 0  Suicidal thoughts 0  PHQ-9 Score 7  Difficult doing work/chores Somewhat difficult    Past Medical History:  Past Medical History:  Diagnosis Date   Asthma    Migraine     Surgical History:  Past Surgical History:  Procedure Laterality Date   HEMORROIDECTOMY      Medications:  Current Outpatient Medications on File Prior to Visit  Medication Sig   Cholecalciferol 50 MCG (2000 UT) CAPS Take by mouth.   cyanocobalamin (VITAMIN B12) 1000 MCG tablet Take by mouth.   No current facility-administered medications on file prior to visit.    Allergies:  Allergies  Allergen Reactions   Other Anaphylaxis and Other (See Comments)    Walnuts,cashews,chestnuts,pecan, etc GI upset, pain/cramping   Chocolate Flavor Hives and Swelling    Social History:  Social History   Socioeconomic History   Marital status: Married    Spouse name: Not on file   Number of children: Not on file   Years of education: Not on file   Highest education level: Not on file  Occupational History   Not on file  Tobacco Use   Smoking status: Never    Smokeless tobacco: Never  Vaping Use   Vaping Use: Never used  Substance and Sexual Activity   Alcohol use: Not Currently   Drug use: Never   Sexual activity: Yes    Birth control/protection: Condom  Other Topics Concern   Not on file  Social History Narrative   Not on file   Social Determinants of Health   Financial Resource Strain: Not on file  Food Insecurity: Not on file  Transportation Needs: Not on file  Physical Activity: Not on file  Stress: Not on file  Social Connections: Not on file  Intimate Partner Violence: Not on file   Social History   Tobacco Use  Smoking Status Never  Smokeless Tobacco Never   Social History   Substance and Sexual Activity  Alcohol Use Not Currently    Family History:  Family History  Problem Relation Age of Onset   Breast cancer Neg Hx     Past medical history, surgical history, medications, allergies, family history and social history reviewed with patient today and changes made to appropriate areas of the chart.   Review of Systems  Constitutional: Negative.   HENT: Negative.    Eyes: Negative.   Respiratory: Negative.    Cardiovascular: Negative.   Gastrointestinal: Negative.   Genitourinary: Negative.   Musculoskeletal: Negative.  Skin: Negative.   Neurological: Negative.   Endo/Heme/Allergies: Negative.   Psychiatric/Behavioral: Negative.     All other ROS negative except what is listed above and in the HPI.      Objective:    BP 122/74   Pulse 60   Temp 98.2 F (36.8 C)   Ht 5' 6.5" (1.689 m)   Wt 120 lb 4.8 oz (54.6 kg)   LMP 02/24/2022 (Exact Date)   SpO2 100%   BMI 19.13 kg/m   Wt Readings from Last 3 Encounters:  03/17/22 120 lb 4.8 oz (54.6 kg)    Physical Exam Vitals and nursing note reviewed.  Constitutional:      General: She is not in acute distress.    Appearance: Normal appearance. She is not ill-appearing, toxic-appearing or diaphoretic.  HENT:     Head: Normocephalic and  atraumatic.     Right Ear: Tympanic membrane, ear canal and external ear normal. There is no impacted cerumen.     Left Ear: Tympanic membrane, ear canal and external ear normal. There is no impacted cerumen.     Nose: Nose normal. No congestion or rhinorrhea.     Mouth/Throat:     Mouth: Mucous membranes are moist.     Pharynx: Oropharynx is clear. No oropharyngeal exudate or posterior oropharyngeal erythema.  Eyes:     General: No scleral icterus.       Right eye: No discharge.        Left eye: No discharge.     Extraocular Movements: Extraocular movements intact.     Conjunctiva/sclera: Conjunctivae normal.     Pupils: Pupils are equal, round, and reactive to light.  Neck:     Vascular: No carotid bruit.  Cardiovascular:     Rate and Rhythm: Normal rate and regular rhythm.     Pulses: Normal pulses.     Heart sounds: No murmur heard.    No friction rub. No gallop.  Pulmonary:     Effort: Pulmonary effort is normal. No respiratory distress.     Breath sounds: Normal breath sounds. No stridor. No wheezing, rhonchi or rales.  Chest:     Chest wall: No tenderness.  Abdominal:     General: Abdomen is flat. Bowel sounds are normal. There is no distension.     Palpations: Abdomen is soft. There is no mass.     Tenderness: There is no abdominal tenderness. There is no right CVA tenderness, left CVA tenderness, guarding or rebound.     Hernia: No hernia is present.  Genitourinary:    Comments: Breast and pelvic exams deferred with shared decision making Musculoskeletal:        General: No swelling, tenderness, deformity or signs of injury.     Cervical back: Normal range of motion and neck supple. No rigidity. No muscular tenderness.     Right lower leg: No edema.     Left lower leg: No edema.  Lymphadenopathy:     Cervical: No cervical adenopathy.  Skin:    General: Skin is warm and dry.     Capillary Refill: Capillary refill takes less than 2 seconds.     Coloration: Skin is  not jaundiced or pale.     Findings: No bruising, erythema, lesion or rash.  Neurological:     General: No focal deficit present.     Mental Status: She is alert and oriented to person, place, and time. Mental status is at baseline.     Cranial Nerves: No cranial  nerve deficit.     Sensory: No sensory deficit.     Motor: No weakness.     Coordination: Coordination normal.     Gait: Gait normal.     Deep Tendon Reflexes: Reflexes normal.  Psychiatric:        Mood and Affect: Mood normal.        Behavior: Behavior normal.        Thought Content: Thought content normal.        Judgment: Judgment normal.     No results found for this or any previous visit.    Assessment & Plan:   Problem List Items Addressed This Visit       Endocrine   Thyroid nodule    Labs drawn today. Has had US and Bx in the past. Continue to monitor.       Relevant Orders   Thyroid Panel With TSH     Other   Grief at loss of child    Doing OK right now- working with grief counseling. Will call with any concerns.       B12 deficiency    Rechecking labs today. Await results.       Relevant Orders   B12   Vitamin D deficiency    Rechecking labs today. Await results.       Relevant Orders   VITAMIN D 25 Hydroxy (Vit-D Deficiency, Fractures)   Other Visit Diagnoses     Routine general medical examination at a health care facility    -  Primary   Vaccines up to date. Screening labs checked today. Pap and mammo up to date. Colonoscopy ordered. Continue diet and exercise. Call with any concerns.    Relevant Orders   CBC with Differential/Platelet   Comprehensive metabolic panel   Lipid Panel w/o Chol/HDL Ratio   Urinalysis, Routine w reflex microscopic   HIV Antibody (routine testing w rflx)   Hepatitis C Antibody   Thyroid Panel With TSH   Need for influenza vaccination       Flu shot given today.   Relevant Orders   Flu Vaccine QUAD 6+ mos PF IM (Fluarix Quad PF) (Completed)   Screening  for colon cancer       Cologuard ordered.    Relevant Orders   Cologuard        Follow up plan: Return in about 1 year (around 03/18/2023) for physical.   LABORATORY TESTING:  - Pap smear: up to date  IMMUNIZATIONS:   - Tdap: Tetanus vaccination status reviewed: last tetanus booster within 10 years. - Influenza: Administered today - Pneumovax: Not applicable - Prevnar: Not applicable - COVID: Up to date - HPV: Not applicable - Shingrix vaccine: Not applicable  SCREENING: -Mammogram: Up to date  - Colonoscopy: Ordered today   PATIENT COUNSELING:   Advised to take 1 mg of folate supplement per day if capable of pregnancy.   Sexuality: Discussed sexually transmitted diseases, partner selection, use of condoms, avoidance of unintended pregnancy  and contraceptive alternatives.   Advised to avoid cigarette smoking.  I discussed with the patient that most people either abstain from alcohol or drink within safe limits (<=14/week and <=4 drinks/occasion for males, <=7/weeks and <= 3 drinks/occasion for females) and that the risk for alcohol disorders and other health effects rises proportionally with the number of drinks per week and how often a drinker exceeds daily limits.  Discussed cessation/primary prevention of drug use and availability of treatment for abuse.   Diet: Encouraged  to adjust caloric intake to maintain  or achieve ideal body weight, to reduce intake of dietary saturated fat and total fat, to limit sodium intake by avoiding high sodium foods and not adding table salt, and to maintain adequate dietary potassium and calcium preferably from fresh fruits, vegetables, and low-fat dairy products.    stressed the importance of regular exercise  Injury prevention: Discussed safety belts, safety helmets, smoke detector, smoking near bedding or upholstery.   Dental health: Discussed importance of regular tooth brushing, flossing, and dental visits.    NEXT PREVENTATIVE  PHYSICAL DUE IN 1 YEAR. Return in about 1 year (around 03/18/2023) for physical.

## 2022-03-18 LAB — COMPREHENSIVE METABOLIC PANEL
ALT: 18 IU/L (ref 0–32)
AST: 21 IU/L (ref 0–40)
Albumin/Globulin Ratio: 2.2 (ref 1.2–2.2)
Albumin: 4.6 g/dL (ref 3.9–4.9)
Alkaline Phosphatase: 72 IU/L (ref 44–121)
BUN/Creatinine Ratio: 18 (ref 9–23)
BUN: 17 mg/dL (ref 6–24)
Bilirubin Total: 1.1 mg/dL (ref 0.0–1.2)
CO2: 20 mmol/L (ref 20–29)
Calcium: 9.7 mg/dL (ref 8.7–10.2)
Chloride: 102 mmol/L (ref 96–106)
Creatinine, Ser: 0.97 mg/dL (ref 0.57–1.00)
Globulin, Total: 2.1 g/dL (ref 1.5–4.5)
Glucose: 83 mg/dL (ref 70–99)
Potassium: 4 mmol/L (ref 3.5–5.2)
Sodium: 139 mmol/L (ref 134–144)
Total Protein: 6.7 g/dL (ref 6.0–8.5)
eGFR: 73 mL/min/{1.73_m2} (ref >59)

## 2022-03-18 LAB — LIPID PANEL W/O CHOL/HDL RATIO
Cholesterol, Total: 205 mg/dL — ABNORMAL HIGH (ref 100–199)
HDL: 67 mg/dL (ref >39)
LDL Chol Calc (NIH): 127 mg/dL — ABNORMAL HIGH (ref 0–99)
Triglycerides: 58 mg/dL (ref 0–149)
VLDL Cholesterol Cal: 11 mg/dL (ref 5–40)

## 2022-03-18 LAB — VITAMIN D 25 HYDROXY (VIT D DEFICIENCY, FRACTURES): Vit D, 25-Hydroxy: 47.8 ng/mL (ref 30.0–100.0)

## 2022-03-18 LAB — CBC WITH DIFFERENTIAL/PLATELET
Basophils Absolute: 0.1 10*3/uL (ref 0.0–0.2)
Basos: 1 %
EOS (ABSOLUTE): 0.2 10*3/uL (ref 0.0–0.4)
Eos: 4 %
Hematocrit: 40.4 % (ref 34.0–46.6)
Hemoglobin: 13.4 g/dL (ref 11.1–15.9)
Immature Grans (Abs): 0 10*3/uL (ref 0.0–0.1)
Immature Granulocytes: 0 %
Lymphocytes Absolute: 1.8 10*3/uL (ref 0.7–3.1)
Lymphs: 32 %
MCH: 32.3 pg (ref 26.6–33.0)
MCHC: 33.2 g/dL (ref 31.5–35.7)
MCV: 97 fL (ref 79–97)
Monocytes Absolute: 0.3 10*3/uL (ref 0.1–0.9)
Monocytes: 6 %
Neutrophils Absolute: 3.3 10*3/uL (ref 1.4–7.0)
Neutrophils: 57 %
Platelets: 202 10*3/uL (ref 150–450)
RBC: 4.15 x10E6/uL (ref 3.77–5.28)
RDW: 11.5 % — ABNORMAL LOW (ref 11.7–15.4)
WBC: 5.7 10*3/uL (ref 3.4–10.8)

## 2022-03-18 LAB — VITAMIN B12: Vitamin B-12: 1536 pg/mL — ABNORMAL HIGH (ref 232–1245)

## 2022-03-18 LAB — HIV ANTIBODY (ROUTINE TESTING W REFLEX): HIV Screen 4th Generation wRfx: NONREACTIVE

## 2022-03-18 LAB — HEPATITIS C ANTIBODY: Hep C Virus Ab: NONREACTIVE

## 2022-03-18 LAB — THYROID PANEL WITH TSH
Free Thyroxine Index: 2 (ref 1.2–4.9)
T3 Uptake Ratio: 29 % (ref 24–39)
T4, Total: 6.9 ug/dL (ref 4.5–12.0)
TSH: 0.771 u[IU]/mL (ref 0.450–4.500)

## 2022-03-22 DIAGNOSIS — Z1211 Encounter for screening for malignant neoplasm of colon: Secondary | ICD-10-CM | POA: Diagnosis not present

## 2022-04-01 LAB — COLOGUARD: COLOGUARD: POSITIVE — AB

## 2022-04-07 ENCOUNTER — Other Ambulatory Visit: Payer: Self-pay | Admitting: Family Medicine

## 2022-04-07 ENCOUNTER — Encounter: Payer: Self-pay | Admitting: Family Medicine

## 2022-04-07 DIAGNOSIS — R195 Other fecal abnormalities: Secondary | ICD-10-CM

## 2022-04-20 DIAGNOSIS — Z23 Encounter for immunization: Secondary | ICD-10-CM | POA: Diagnosis not present

## 2022-04-23 NOTE — Telephone Encounter (Signed)
Leave for Dr. Wynetta Emery review, I updated her immunization list with this recent [place].

## 2022-06-07 DIAGNOSIS — M7630 Iliotibial band syndrome, unspecified leg: Secondary | ICD-10-CM | POA: Insufficient documentation

## 2022-06-07 DIAGNOSIS — M7632 Iliotibial band syndrome, left leg: Secondary | ICD-10-CM | POA: Diagnosis not present

## 2022-08-10 DIAGNOSIS — D2262 Melanocytic nevi of left upper limb, including shoulder: Secondary | ICD-10-CM | POA: Diagnosis not present

## 2022-08-10 DIAGNOSIS — H01131 Eczematous dermatitis of right upper eyelid: Secondary | ICD-10-CM | POA: Diagnosis not present

## 2022-08-10 DIAGNOSIS — D225 Melanocytic nevi of trunk: Secondary | ICD-10-CM | POA: Diagnosis not present

## 2022-08-10 DIAGNOSIS — H01134 Eczematous dermatitis of left upper eyelid: Secondary | ICD-10-CM | POA: Diagnosis not present

## 2023-01-19 IMAGING — MG MM DIGITAL SCREENING BILAT W/ TOMO AND CAD
8 series · 9 of 24 positions shown · non-contrast
Comparison: Previous exam(s).

CLINICAL DATA: Screening.

EXAM:
DIGITAL SCREENING BILATERAL MAMMOGRAM WITH TOMOSYNTHESIS AND CAD
TECHNIQUE: Bilateral screening digital craniocaudal and mediolateral oblique
mammograms were obtained. Bilateral screening digital breast
tomosynthesis was performed.

[L MLO synth-2D]
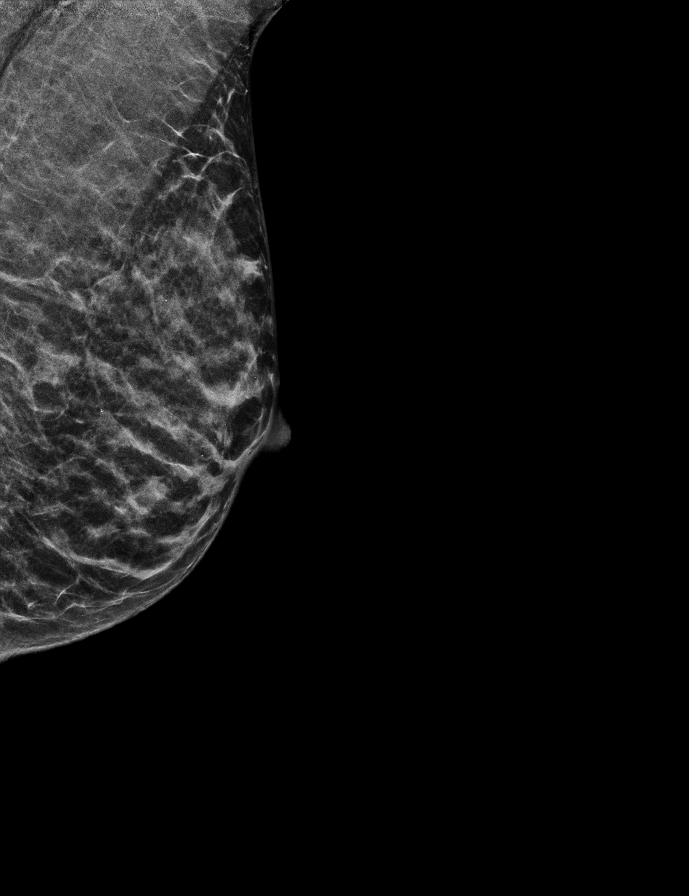

[R MLO synth-2D]
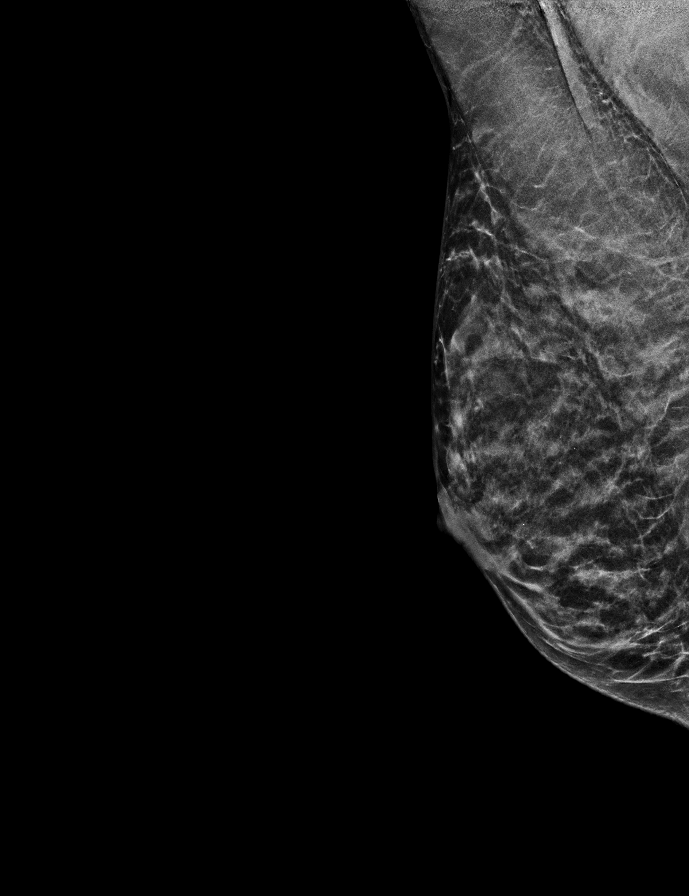

[L CC synth-2D]
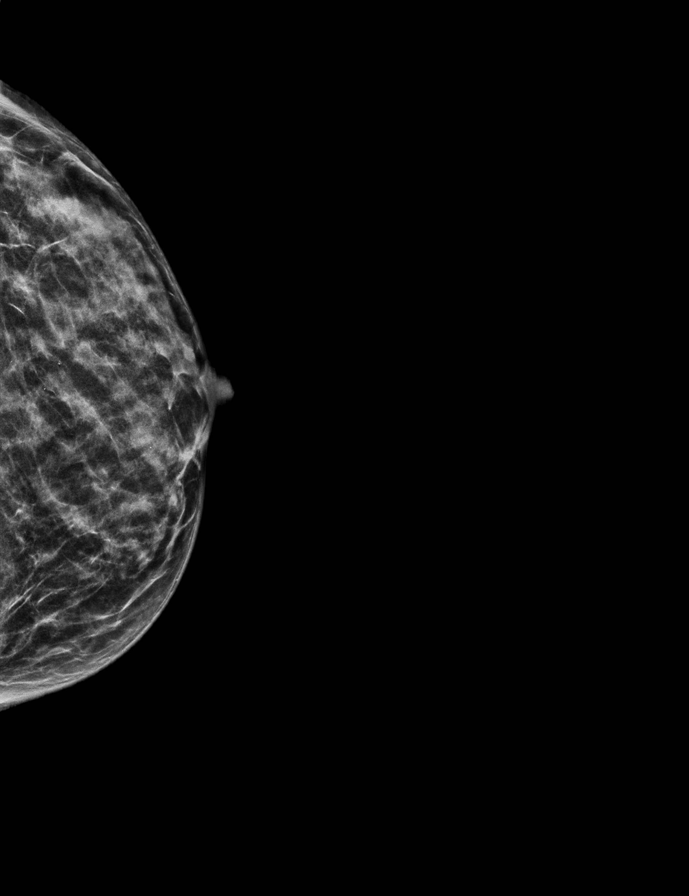

[R CC synth-2D]
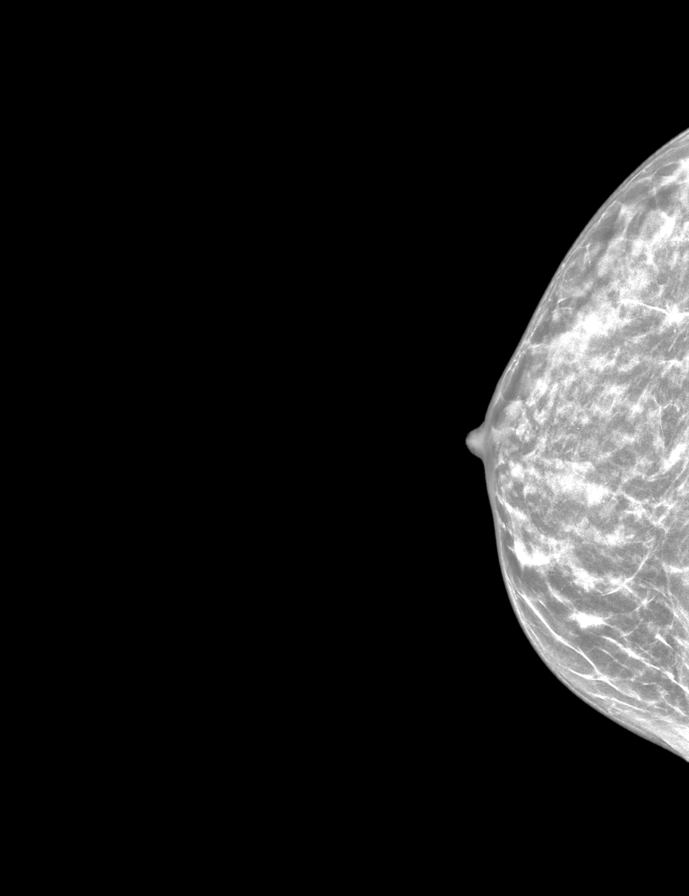

[L MLO tomo · 2 of 38 frames shown]
[frame 13/38]
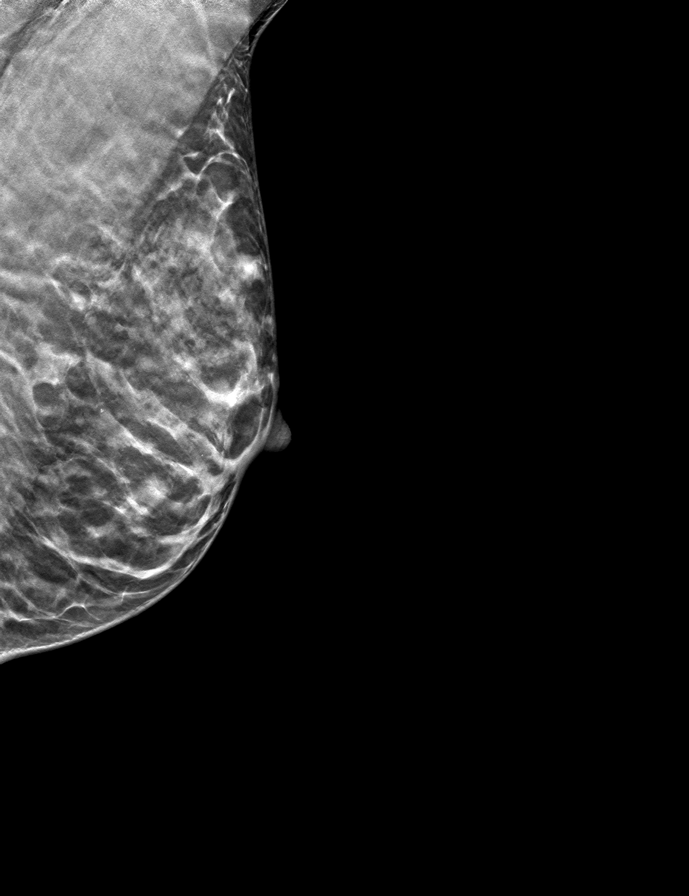
[frame 19/38]
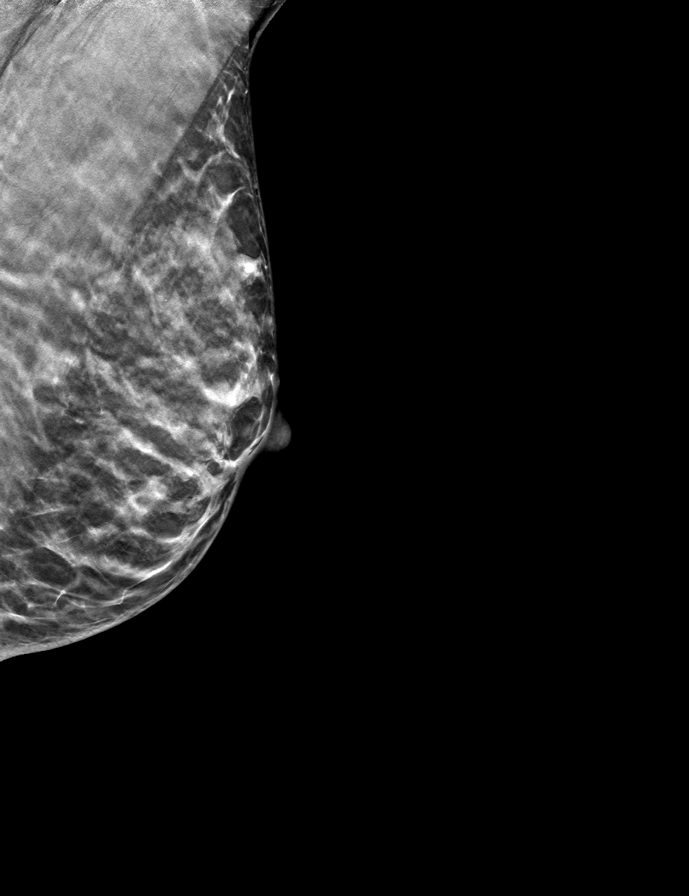

[R MLO tomo · tomo slice 21/40.0]
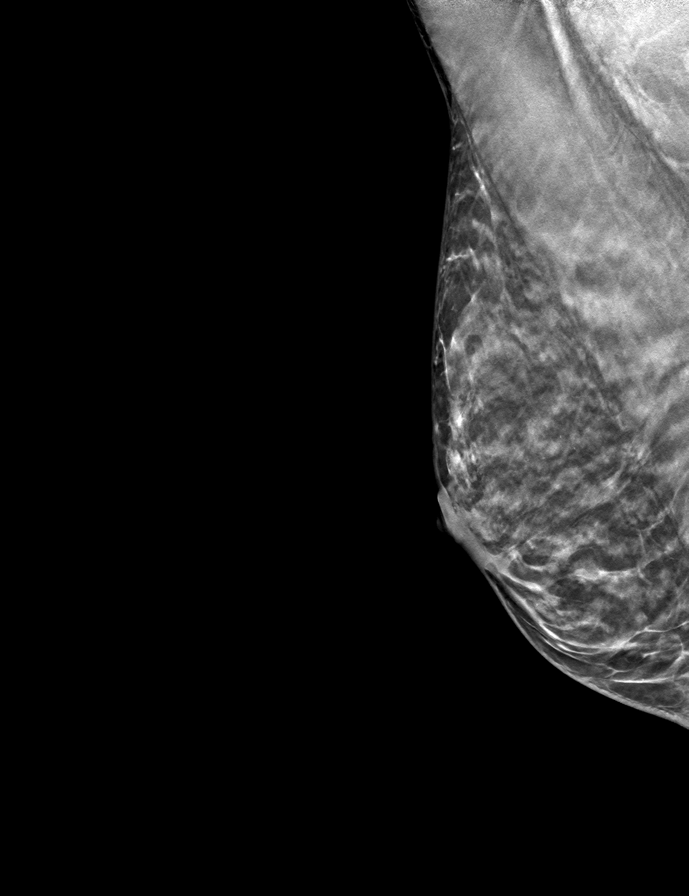

[R CC tomo · tomo slice 22/43.0]
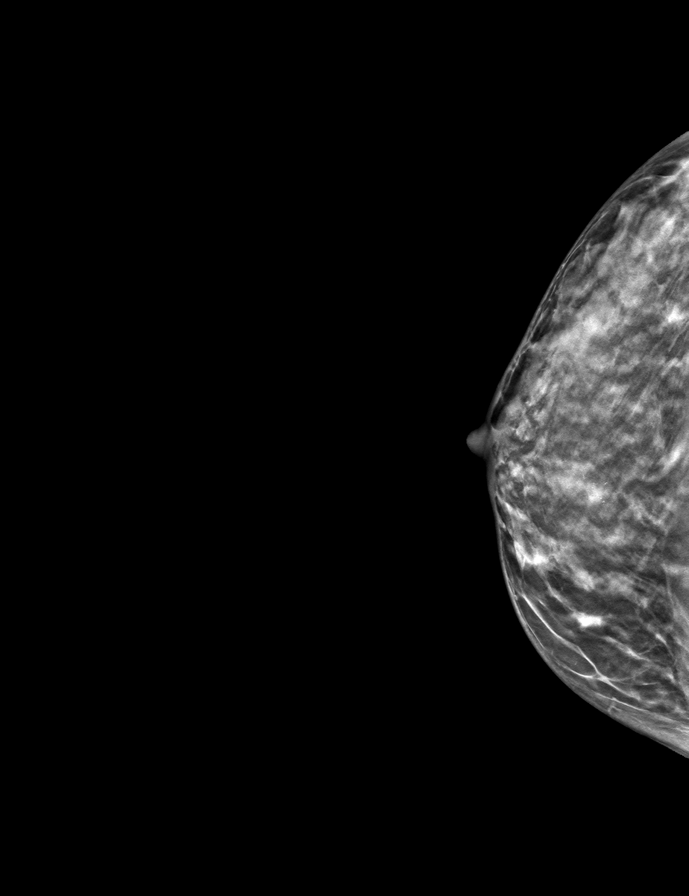

[L CC tomo · tomo slice 21/41.0]
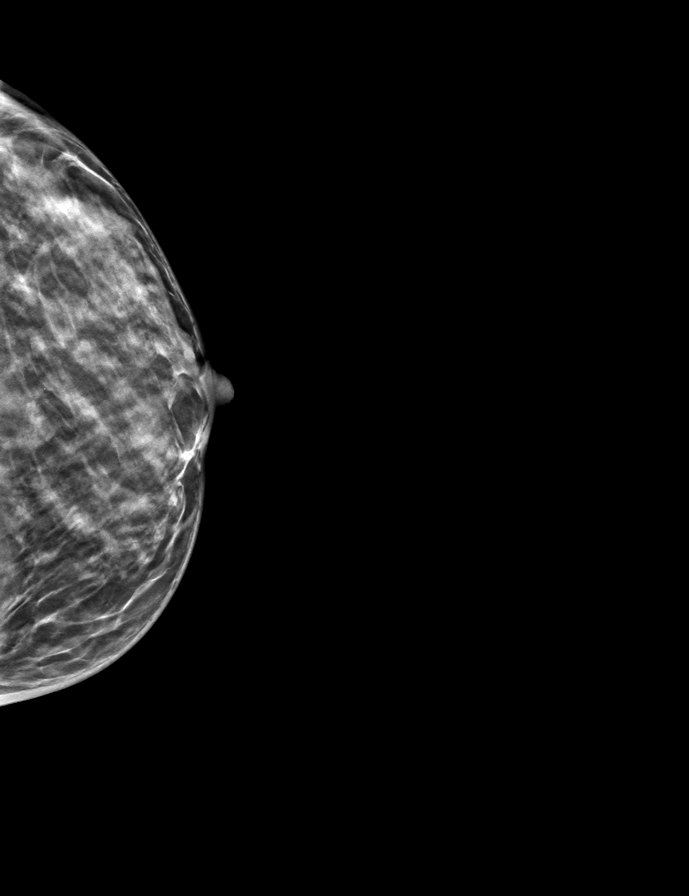

[9 of 24 positions shown; findings below may reference images not displayed]

ACR Breast Density Category c: The breast tissue is heterogeneously
dense, which may obscure small masses.
FINDINGS: There are no findings suspicious for malignancy.
IMPRESSION: No mammographic evidence of malignancy. A result letter of this
screening mammogram will be mailed directly to the patient.

RECOMMENDATION:
Screening mammogram in one year. (Code:BK-M-2N3)

BI-RADS CATEGORY  1: Negative.

## 2023-03-21 ENCOUNTER — Encounter: Payer: Self-pay | Admitting: Family Medicine

## 2023-03-21 ENCOUNTER — Ambulatory Visit (INDEPENDENT_AMBULATORY_CARE_PROVIDER_SITE_OTHER): Payer: BC Managed Care – PPO | Admitting: Family Medicine

## 2023-03-21 VITALS — BP 93/59 | HR 68 | Ht 66.5 in | Wt 123.4 lb

## 2023-03-21 DIAGNOSIS — E538 Deficiency of other specified B group vitamins: Secondary | ICD-10-CM | POA: Diagnosis not present

## 2023-03-21 DIAGNOSIS — E559 Vitamin D deficiency, unspecified: Secondary | ICD-10-CM | POA: Diagnosis not present

## 2023-03-21 DIAGNOSIS — R195 Other fecal abnormalities: Secondary | ICD-10-CM | POA: Diagnosis not present

## 2023-03-21 DIAGNOSIS — Z0000 Encounter for general adult medical examination without abnormal findings: Secondary | ICD-10-CM

## 2023-03-21 MED ORDER — EPINEPHRINE 0.3 MG/0.3ML IJ SOAJ: 0.3000 mg | INTRAMUSCULAR | 12 refills | Status: DC | PRN

## 2023-03-21 NOTE — Progress Notes (Signed)
BP (!) 93/59 (BP Location: Left Arm, Cuff Size: Normal)   Pulse 68   Ht 5' 6.5" (1.689 m)   Wt 123 lb 6.4 oz (56 kg)   SpO2 99%   BMI 19.62 kg/m    Subjective:    Patient ID: Tasha Chang, female    DOB: [birth year], 47 y.o.   MRN: [id number]  HPI: Zyaria Mangiapane is a 47 y.o. female presenting on 03/21/2023 for comprehensive medical examination. Current medical complaints include:none   Menopausal Symptoms: no  Depression Screen done today and results listed below:     03/21/2023    9:05 AM 03/17/2022   11:29 AM  Depression screen PHQ 2/9  Decreased Interest 1 1  Down, Depressed, Hopeless 1 2  PHQ - 2 Score 2 3  Altered sleeping 1 1  Tired, decreased energy 0 0  Change in appetite 0 0  Feeling bad or failure about yourself  1 2  Trouble concentrating 0 1  Moving slowly or fidgety/restless 0 0  Suicidal thoughts 0 0  PHQ-9 Score 4 7  Difficult doing work/chores Not difficult at all Somewhat difficult    Past Medical History:  Past Medical History:  Diagnosis Date   Asthma    Migraine     Surgical History:  Past Surgical History:  Procedure Laterality Date   HEMORROIDECTOMY      Medications:  Current Outpatient Medications on File Prior to Visit  Medication Sig   Cholecalciferol 50 MCG (2000 UT) CAPS Take by mouth.   cyanocobalamin (VITAMIN B12) 1000 MCG tablet Take by mouth.   No current facility-administered medications on file prior to visit.    Allergies:  Allergies  Allergen Reactions   Other Anaphylaxis and Other (See Comments)    Walnuts,cashews,chestnuts,pecan, etc GI upset, pain/cramping   Chocolate Flavor Hives and Swelling    Social History:  Social History   Socioeconomic History   Marital status: Married    Spouse name: Not on file   Number of children: Not on file   Years of education: Not on file   Highest education level: Not on file  Occupational History   Not on file  Tobacco Use   Smoking status: Never    Smokeless tobacco: Never  Vaping Use   Vaping status: Never Used  Substance and Sexual Activity   Alcohol use: Not Currently   Drug use: Never   Sexual activity: Yes    Birth control/protection: Condom  Other Topics Concern   Not on file  Social History Narrative   Not on file   Social Determinants of Health   Financial Resource Strain: Not on file  Food Insecurity: Not on file  Transportation Needs: Not on file  Physical Activity: Not on file  Stress: Not on file  Social Connections: Not on file  Intimate Partner Violence: Not on file   Social History   Tobacco Use  Smoking Status Never  Smokeless Tobacco Never   Social History   Substance and Sexual Activity  Alcohol Use Not Currently    Family History:  Family History  Problem Relation Age of Onset   Breast cancer Neg Hx     Past medical history, surgical history, medications, allergies, family history and social history reviewed with patient today and changes made to appropriate areas of the chart.   Review of Systems  Constitutional: Negative.   HENT: Negative.    Eyes: Negative.   Respiratory: Negative.    Cardiovascular: Negative.   Gastrointestinal: Negative.  Negative for abdominal pain, blood in stool, constipation, diarrhea, heartburn, melena, nausea and vomiting.  Genitourinary: Negative.   Musculoskeletal: Negative.   Skin: Negative.   Neurological: Negative.   Endo/Heme/Allergies:  Positive for environmental allergies. Negative for polydipsia. Does not bruise/bleed easily.  Psychiatric/Behavioral: Negative.     All other ROS negative except what is listed above and in the HPI.      Objective:    BP (!) 93/59 (BP Location: Left Arm, Cuff Size: Normal)   Pulse 68   Ht 5' 6.5" (1.689 m)   Wt 123 lb 6.4 oz (56 kg)   SpO2 99%   BMI 19.62 kg/m   Wt Readings from Last 3 Encounters:  03/21/23 123 lb 6.4 oz (56 kg)  03/17/22 120 lb 4.8 oz (54.6 kg)    Physical Exam Vitals and nursing note  reviewed.  Constitutional:      General: She is not in acute distress.    Appearance: Normal appearance. She is normal weight. She is not ill-appearing, toxic-appearing or diaphoretic.  HENT:     Head: Normocephalic and atraumatic.     Right Ear: Tympanic membrane, ear canal and external ear normal. There is no impacted cerumen.     Left Ear: Tympanic membrane, ear canal and external ear normal. There is no impacted cerumen.     Nose: Nose normal. No congestion or rhinorrhea.     Mouth/Throat:     Mouth: Mucous membranes are moist.     Pharynx: Oropharynx is clear. No oropharyngeal exudate or posterior oropharyngeal erythema.  Eyes:     General: No scleral icterus.       Right eye: No discharge.        Left eye: No discharge.     Extraocular Movements: Extraocular movements intact.     Conjunctiva/sclera: Conjunctivae normal.     Pupils: Pupils are equal, round, and reactive to light.  Neck:     Vascular: No carotid bruit.  Cardiovascular:     Rate and Rhythm: Normal rate and regular rhythm.     Pulses: Normal pulses.     Heart sounds: No murmur heard.    No friction rub. No gallop.  Pulmonary:     Effort: Pulmonary effort is normal. No respiratory distress.     Breath sounds: Normal breath sounds. No stridor. No wheezing, rhonchi or rales.  Chest:     Chest wall: No tenderness.  Abdominal:     General: Abdomen is flat. Bowel sounds are normal. There is no distension.     Palpations: Abdomen is soft. There is no mass.     Tenderness: There is no abdominal tenderness. There is no right CVA tenderness, left CVA tenderness, guarding or rebound.     Hernia: No hernia is present.  Genitourinary:    Comments: Breast and pelvic exams deferred with shared decision making Musculoskeletal:        General: No swelling, tenderness, deformity or signs of injury.     Cervical back: Normal range of motion and neck supple. No rigidity. No muscular tenderness.     Right lower leg: No edema.      Left lower leg: No edema.  Lymphadenopathy:     Cervical: No cervical adenopathy.  Skin:    General: Skin is warm and dry.     Capillary Refill: Capillary refill takes less than 2 seconds.     Coloration: Skin is not jaundiced or pale.     Findings: No bruising, erythema, lesion or rash.  Neurological:     General: No focal deficit present.     Mental Status: She is alert and oriented to person, place, and time. Mental status is at baseline.     Cranial Nerves: No cranial nerve deficit.     Sensory: No sensory deficit.     Motor: No weakness.     Coordination: Coordination normal.     Gait: Gait normal.     Deep Tendon Reflexes: Reflexes normal.  Psychiatric:        Mood and Affect: Mood normal.        Behavior: Behavior normal.        Thought Content: Thought content normal.        Judgment: Judgment normal.     Results for orders placed or performed in visit on 03/17/22  HM PAP SMEAR  Result Value Ref Range   HM Pap smear see result scanned into chart       Assessment & Plan:   Problem List Items Addressed This Visit       Other   B12 deficiency    Labs drawn today. Await results. Treat as needed.       Relevant Orders   B12   Vitamin D deficiency    Labs drawn today. Await results. Treat as needed.       Relevant Orders   VITAMIN D 25 Hydroxy (Vit-D Deficiency, Fractures)   Other Visit Diagnoses     Routine general medical examination at a health care facility    -  Primary   Vaccines up to date. Screening labs checked today. Pap and mammo up to date. Colonoscopy ordered. Continue diet and exercise. Call with any concerns.   Relevant Orders   CBC with Differential/Platelet   Comprehensive metabolic panel   Lipid Panel w/o Chol/HDL Ratio   TSH   Positive colorectal cancer screening using Cologuard test       Referral to GI placed today.   Relevant Orders   Ambulatory referral to Gastroenterology        Follow up plan: Return in about 1 year  (around 03/20/2024) for physical.   LABORATORY TESTING:  - Pap smear: up to date  IMMUNIZATIONS:   - Tdap: Tetanus vaccination status reviewed: last tetanus booster within 10 years. - Influenza: Given elsewhere - Pneumovax: Not applicable - Prevnar: Refused - COVID: Given elsewhere - HPV: Not applicable - Shingrix vaccine: Not applicable  SCREENING: -Mammogram: Up to date  - Colonoscopy: Ordered today   PATIENT COUNSELING:   Advised to take 1 mg of folate supplement per day if capable of pregnancy.   Sexuality: Discussed sexually transmitted diseases, partner selection, use of condoms, avoidance of unintended pregnancy  and contraceptive alternatives.   Advised to avoid cigarette smoking.  I discussed with the patient that most people either abstain from alcohol or drink within safe limits (<=14/week and <=4 drinks/occasion for males, <=7/weeks and <= 3 drinks/occasion for females) and that the risk for alcohol disorders and other health effects rises proportionally with the number of drinks per week and how often a drinker exceeds daily limits.  Discussed cessation/primary prevention of drug use and availability of treatment for abuse.   Diet: Encouraged to adjust caloric intake to maintain  or achieve ideal body weight, to reduce intake of dietary saturated fat and total fat, to limit sodium intake by avoiding high sodium foods and not adding table salt, and to maintain adequate dietary potassium and calcium preferably from fresh fruits, vegetables, and  low-fat dairy products.    stressed the importance of regular exercise  Injury prevention: Discussed safety belts, safety helmets, smoke detector, smoking near bedding or upholstery.   Dental health: Discussed importance of regular tooth brushing, flossing, and dental visits.    NEXT PREVENTATIVE PHYSICAL DUE IN 1 YEAR. Return in about 1 year (around 03/20/2024) for physical.

## 2023-03-21 NOTE — Assessment & Plan Note (Signed)
Labs drawn today. Await results. Treat as needed.

## 2023-03-21 NOTE — Assessment & Plan Note (Signed)
Labs drawn today. Await results. Treat as needed.  

## 2023-03-22 LAB — CBC WITH DIFFERENTIAL/PLATELET
Basophils Absolute: 0 10*3/uL (ref 0.0–0.2)
Basos: 1 %
EOS (ABSOLUTE): 0.2 10*3/uL (ref 0.0–0.4)
Eos: 4 %
Hematocrit: 38.2 % (ref 34.0–46.6)
Hemoglobin: 12.6 g/dL (ref 11.1–15.9)
Immature Grans (Abs): 0 10*3/uL (ref 0.0–0.1)
Immature Granulocytes: 0 %
Lymphocytes Absolute: 1.3 10*3/uL (ref 0.7–3.1)
Lymphs: 28 %
MCH: 33 pg (ref 26.6–33.0)
MCHC: 33 g/dL (ref 31.5–35.7)
MCV: 100 fL — ABNORMAL HIGH (ref 79–97)
Monocytes Absolute: 0.3 10*3/uL (ref 0.1–0.9)
Monocytes: 6 %
Neutrophils Absolute: 2.8 10*3/uL (ref 1.4–7.0)
Neutrophils: 61 %
Platelets: 195 10*3/uL (ref 150–450)
RBC: 3.82 x10E6/uL (ref 3.77–5.28)
RDW: 11.4 % — ABNORMAL LOW (ref 11.7–15.4)
WBC: 4.6 10*3/uL (ref 3.4–10.8)

## 2023-03-22 LAB — COMPREHENSIVE METABOLIC PANEL
ALT: 15 IU/L (ref 0–32)
AST: 17 IU/L (ref 0–40)
Albumin: 3.9 g/dL (ref 3.9–4.9)
Alkaline Phosphatase: 71 IU/L (ref 44–121)
BUN/Creatinine Ratio: 14 (ref 9–23)
BUN: 14 mg/dL (ref 6–24)
Bilirubin Total: 1.1 mg/dL (ref 0.0–1.2)
CO2: 22 mmol/L (ref 20–29)
Calcium: 9.1 mg/dL (ref 8.7–10.2)
Chloride: 105 mmol/L (ref 96–106)
Creatinine, Ser: 0.98 mg/dL (ref 0.57–1.00)
Globulin, Total: 1.9 g/dL (ref 1.5–4.5)
Glucose: 59 mg/dL — ABNORMAL LOW (ref 70–99)
Potassium: 4.5 mmol/L (ref 3.5–5.2)
Sodium: 139 mmol/L (ref 134–144)
Total Protein: 5.8 g/dL — ABNORMAL LOW (ref 6.0–8.5)
eGFR: 72 mL/min/{1.73_m2} (ref >59)

## 2023-03-22 LAB — LIPID PANEL W/O CHOL/HDL RATIO
Cholesterol, Total: 168 mg/dL (ref 100–199)
HDL: 61 mg/dL (ref >39)
LDL Chol Calc (NIH): 94 mg/dL (ref 0–99)
Triglycerides: 69 mg/dL (ref 0–149)
VLDL Cholesterol Cal: 13 mg/dL (ref 5–40)

## 2023-03-22 LAB — TSH: TSH: 0.975 u[IU]/mL (ref 0.450–4.500)

## 2023-03-22 LAB — VITAMIN B12: Vitamin B-12: 828 pg/mL (ref 232–1245)

## 2023-03-22 LAB — VITAMIN D 25 HYDROXY (VIT D DEFICIENCY, FRACTURES): Vit D, 25-Hydroxy: 40.4 ng/mL (ref 30.0–100.0)

## 2023-03-29 DIAGNOSIS — Z23 Encounter for immunization: Secondary | ICD-10-CM | POA: Diagnosis not present

## 2023-03-30 ENCOUNTER — Other Ambulatory Visit: Payer: Self-pay | Admitting: *Deleted

## 2023-03-30 ENCOUNTER — Telehealth: Payer: Self-pay | Admitting: *Deleted

## 2023-03-30 DIAGNOSIS — Z1211 Encounter for screening for malignant neoplasm of colon: Secondary | ICD-10-CM

## 2023-03-30 DIAGNOSIS — R195 Other fecal abnormalities: Secondary | ICD-10-CM

## 2023-03-30 MED ORDER — NA SULFATE-K SULFATE-MG SULF 17.5-3.13-1.6 GM/177ML PO SOLN: 1.0000 | Freq: Once | ORAL | 0 refills | Status: AC

## 2023-03-30 NOTE — Telephone Encounter (Signed)
Gastroenterology Pre-Procedure Review  Request Date: 05/05/2023 Requesting Physician: Dr. Allegra Lai  PATIENT REVIEW QUESTIONS: The patient responded to the following health history questions as indicated:    1. Are you having any GI issues? no 2. Do you have a personal history of Polyps? no 3. Do you have a family history of Colon Cancer or Polyps? no 4. Diabetes Mellitus? no 5. Joint replacements in the past 12 months?no 6. Major health problems in the past 3 months?no 7. Any artificial heart valves, MVP, or defibrillator?no    MEDICATIONS & ALLERGIES:    Patient reports the following regarding taking any anticoagulation/antiplatelet therapy:   Plavix, Coumadin, Eliquis, Xarelto, Lovenox, Pradaxa, Brilinta, or Effient? no Aspirin? no  Patient confirms/reports the following medications:  Current Outpatient Medications  Medication Sig Dispense Refill   Cholecalciferol 50 MCG (2000 UT) CAPS Take by mouth.     cyanocobalamin (VITAMIN B12) 1000 MCG tablet Take by mouth.     EPINEPHrine (EPIPEN 2-PAK) 0.3 mg/0.3 mL IJ SOAJ injection Inject 0.3 mg into the muscle as needed for anaphylaxis. 1 each 12   No current facility-administered medications for this visit.    Patient confirms/reports the following allergies:  Allergies  Allergen Reactions   Other Anaphylaxis and Other (See Comments)    Walnuts,cashews,chestnuts,pecan, etc GI upset, pain/cramping   Chocolate Flavor Hives and Swelling    No orders of the defined types were placed in this encounter.   AUTHORIZATION INFORMATION Primary Insurance: 1D#: Group #:  Secondary Insurance: 1D#: Group #:  SCHEDULE INFORMATION: Date: 05/05/2023 Time: Location: [facility]

## 2023-05-05 ENCOUNTER — Encounter
Admission: RE | Disposition: A | Discharge status: Home / Self Care | Payer: Self-pay | Source: Home / Self Care | Attending: Gastroenterology

## 2023-05-05 ENCOUNTER — Ambulatory Visit: Payer: BC Managed Care – PPO | Admitting: Anesthesiology

## 2023-05-05 ENCOUNTER — Encounter: Payer: Self-pay | Admitting: Gastroenterology

## 2023-05-05 ENCOUNTER — Other Ambulatory Visit: Payer: Self-pay

## 2023-05-05 ENCOUNTER — Ambulatory Visit
Admission: RE | Admit: 2023-05-05 | Discharge: 2023-05-05 | Disposition: A | Discharge status: Home / Self Care | Payer: BC Managed Care – PPO | Attending: Gastroenterology | Admitting: Gastroenterology

## 2023-05-05 DIAGNOSIS — K621 Rectal polyp: Secondary | ICD-10-CM

## 2023-05-05 DIAGNOSIS — Z1211 Encounter for screening for malignant neoplasm of colon: Secondary | ICD-10-CM | POA: Diagnosis not present

## 2023-05-05 DIAGNOSIS — K635 Polyp of colon: Secondary | ICD-10-CM

## 2023-05-05 DIAGNOSIS — D128 Benign neoplasm of rectum: Secondary | ICD-10-CM | POA: Diagnosis not present

## 2023-05-05 DIAGNOSIS — D123 Benign neoplasm of transverse colon: Secondary | ICD-10-CM

## 2023-05-05 HISTORY — PX: HEMOSTASIS CLIP PLACEMENT: SHX6857

## 2023-05-05 HISTORY — PX: POLYPECTOMY: SHX5525

## 2023-05-05 HISTORY — PX: COLONOSCOPY WITH PROPOFOL: SHX5780

## 2023-05-05 LAB — POCT PREGNANCY, URINE: Preg Test, Ur: NEGATIVE

## 2023-05-05 SURGERY — COLONOSCOPY WITH PROPOFOL
Anesthesia: General

## 2023-05-05 MED ORDER — PROPOFOL 500 MG/50ML IV EMUL
INTRAVENOUS | Status: DC | PRN
  Administered 2023-05-05: 100 ug/kg/min via INTRAVENOUS

## 2023-05-05 MED ORDER — MIDAZOLAM HCL 2 MG/2ML IJ SOLN
INTRAMUSCULAR | Status: DC | PRN
  Administered 2023-05-05: 2 mg via INTRAVENOUS

## 2023-05-05 MED ORDER — MIDAZOLAM HCL 2 MG/2ML IJ SOLN
INTRAMUSCULAR | Status: AC
  Filled 2023-05-05: qty 2

## 2023-05-05 MED ORDER — SEVOFLURANE IN SOLN
RESPIRATORY_TRACT | Status: AC
  Filled 2023-05-05: qty 250

## 2023-05-05 MED ORDER — SIMETHICONE 40 MG/0.6ML PO SUSP
ORAL | Status: DC | PRN
  Administered 2023-05-05: 120 mL

## 2023-05-05 MED ORDER — SODIUM CHLORIDE 0.9 % IV SOLN: INTRAVENOUS | Status: DC

## 2023-05-05 MED ORDER — PHENYLEPHRINE 80 MCG/ML (10ML) SYRINGE FOR IV PUSH (FOR BLOOD PRESSURE SUPPORT)
PREFILLED_SYRINGE | INTRAVENOUS | Status: DC | PRN
  Administered 2023-05-05: 80 ug via INTRAVENOUS

## 2023-05-05 MED ORDER — PROPOFOL 10 MG/ML IV BOLUS
INTRAVENOUS | Status: DC | PRN
  Administered 2023-05-05: 100 mg via INTRAVENOUS

## 2023-05-05 NOTE — H&P (Signed)
  Arlyss Repress, MD [address]  [address]  [place], [place] [address]  Main: [phone]  Fax: [phone] Pager: [phone]  Primary Care Physician:  Dorcas Carrow, DO Primary Gastroenterologist:  Dr. Arlyss Repress  Pre-Procedure History & Physical: HPI:  Tasha Chang is a 47 y.o. female is here for an colonoscopy.   Past Medical History:  Diagnosis Date   Asthma    Migraine     Past Surgical History:  Procedure Laterality Date   HEMORROIDECTOMY      Prior to Admission medications   Medication Sig Start Date End Date Taking? Authorizing Provider  Cholecalciferol 50 MCG (2000 UT) CAPS Take by mouth.   Yes [provider]  cyanocobalamin (VITAMIN B12) 1000 MCG tablet Take by mouth.   Yes [provider]  EPINEPHrine (EPIPEN 2-PAK) 0.3 mg/0.3 mL IJ SOAJ injection Inject 0.3 mg into the muscle as needed for anaphylaxis. 03/21/23   Olevia Perches P, DO    Allergies as of 03/31/2023 - Review Complete 03/21/2023  Allergen Reaction Noted   Other Anaphylaxis and Other (See Comments) 08/12/2016   Chocolate flavor Hives and Swelling 08/25/2016    Family History  Problem Relation Age of Onset   Breast cancer Neg Hx     Social History   Socioeconomic History   Marital status: Married    Spouse name: Not on file   Number of children: Not on file   Years of education: Not on file   Highest education level: Not on file  Occupational History   Not on file  Tobacco Use   Smoking status: Never   Smokeless tobacco: Never  Vaping Use   Vaping status: Never Used  Substance and Sexual Activity   Alcohol use: Not Currently   Drug use: Never   Sexual activity: Yes    Birth control/protection: Condom  Other Topics Concern   Not on file  Social History Narrative   Not on file   Social Determinants of Health   Financial Resource Strain: Not on file  Food Insecurity: Not on file  Transportation Needs: Not on file  Physical Activity:  Not on file  Stress: Not on file  Social Connections: Not on file  Intimate Partner Violence: Not on file    Review of Systems: See HPI, otherwise negative ROS  Physical Exam: BP 100/68   Pulse (!) 55   Temp (!) 96.7 F (35.9 C) (Temporal)   Resp 16   Ht 5\' 6"  (1.676 m)   Wt 54.1 kg   SpO2 100%   BMI 19.24 kg/m  General:   Alert,  pleasant and cooperative in NAD Head:  Normocephalic and atraumatic. Neck:  Supple; no masses or thyromegaly. Lungs:  Clear throughout to auscultation.    Heart:  Regular rate and rhythm. Abdomen:  Soft, nontender and nondistended. Normal bowel sounds, without guarding, and without rebound.   Neurologic:  Alert and  oriented x4;  grossly normal neurologically.  Impression/Plan: Levonne Carreras is here for an colonoscopy to be performed for colon cancer screening  Risks, benefits, limitations, and alternatives regarding  colonoscopy have been reviewed with the patient.  Questions have been answered.  All parties agreeable.   Lannette Donath, MD  05/05/2023, 11:52 AM

## 2023-05-05 NOTE — Anesthesia Preprocedure Evaluation (Signed)
Anesthesia Evaluation  Patient identified by MRN, date of birth, ID band Patient awake    Reviewed: Allergy & Precautions, NPO status , Patient's Chart, lab work & pertinent test results  History of Anesthesia Complications Negative for: history of anesthetic complications  Airway Mallampati: II  TM Distance: >3 FB Neck ROM: Full    Dental no notable dental hx. (+) Teeth Intact   Pulmonary asthma , neg sleep apnea, neg COPD, Patient abstained from smoking.Not current smoker   Pulmonary exam normal breath sounds clear to auscultation       Cardiovascular Exercise Tolerance: Good METS(-) hypertension(-) CAD and (-) Past MI negative cardio ROS (-) dysrhythmias  Rhythm:Regular Rate:Normal - Systolic murmurs    Neuro/Psych  Headaches  negative psych ROS   GI/Hepatic ,neg GERD  ,,(+)     (-) substance abuse    Endo/Other  neg diabetes    Renal/GU negative Renal ROS     Musculoskeletal   Abdominal   Peds  Hematology   Anesthesia Other Findings Past Medical History: No date: Asthma No date: Migraine  Reproductive/Obstetrics                             Anesthesia Physical Anesthesia Plan  ASA: 2  Anesthesia Plan: General   Post-op Pain Management: Minimal or no pain anticipated   Induction: Intravenous  PONV Risk Score and Plan: 3 and Propofol infusion, TIVA and Ondansetron  Airway Management Planned: Nasal Cannula  Additional Equipment: None  Intra-op Plan:   Post-operative Plan:   Informed Consent: I have reviewed the patients History and Physical, chart, labs and discussed the procedure including the risks, benefits and alternatives for the proposed anesthesia with the patient or authorized representative who has indicated his/her understanding and acceptance.     Dental advisory given  Plan Discussed with: CRNA and Surgeon  Anesthesia Plan Comments: (Discussed risks of  anesthesia with patient, including possibility of difficulty with spontaneous ventilation under anesthesia necessitating airway intervention, PONV, and rare risks such as cardiac or respiratory or neurological events, and allergic reactions. Discussed the role of CRNA in patient's perioperative care. Patient understands.)       Anesthesia Quick Evaluation

## 2023-05-05 NOTE — Transfer of Care (Signed)
Immediate Anesthesia Transfer of Care Note  Patient: Tasha Chang  Procedure(s) Performed: COLONOSCOPY WITH PROPOFOL POLYPECTOMY  Patient Location: PACU  Anesthesia Type:General  Level of Consciousness: awake, alert , and oriented  Airway & Oxygen Therapy: Patient Spontanous Breathing and Patient connected to nasal cannula oxygen  Post-op Assessment: Report given to RN, Post -op Vital signs reviewed and stable, and Patient moving all extremities  Post vital signs: Reviewed and stable  Last Vitals:  Vitals Value Taken Time  BP 100/63 05/05/23 1310  Temp 35.8 C 05/05/23 1309  Pulse 56 05/05/23 1313  Resp    SpO2 100 % 05/05/23 1313  Vitals shown include unfiled device data.  Last Pain:  Vitals:   05/05/23 1309  TempSrc: Temporal  PainSc: 0-No pain         Complications: No notable events documented.

## 2023-05-05 NOTE — Op Note (Signed)
[facility] Gastroenterology Patient Name: Tasha Chang Procedure Date: 05/05/2023 12:12 PM MRN: [id number] Account #: [id number] Date of Birth: June 28, 1976 Admit Type: Outpatient Age: 47 Room: [facility] ENDO ROOM 3 Gender: Female Note Status: Finalized Instrument Name: Prentice Docker [id number] Procedure:             Colonoscopy Indications:           Screening for colorectal malignant neoplasm, This is                         the patient's first colonoscopy Providers:             Toney Reil MD, MD Referring MD:          Dorcas Carrow (Referring MD) Medicines:             General Anesthesia Complications:         No immediate complications. Estimated blood loss: None. Procedure:             Pre-Anesthesia Assessment:                        - Prior to the procedure, a History and Physical was                         performed, and patient medications and allergies were                         reviewed. The patient is competent. The risks and                         benefits of the procedure and the sedation options and                         risks were discussed with the patient. All questions                         were answered and informed consent was obtained.                         Patient identification and proposed procedure were                         verified by the physician, the nurse, the                         anesthesiologist, the anesthetist and the technician                         in the pre-procedure area in the procedure room in the                         endoscopy suite. Mental Status Examination: alert and                         oriented. Airway Examination: normal oropharyngeal                         airway and neck mobility. Respiratory Examination:  clear to auscultation. CV Examination: normal.                         Prophylactic Antibiotics: The patient does not require                          prophylactic antibiotics. Prior Anticoagulants: The                         patient has taken no anticoagulant or antiplatelet                         agents. ASA Grade Assessment: II - A patient with mild                         systemic disease. After reviewing the risks and                         benefits, the patient was deemed in satisfactory                         condition to undergo the procedure. The anesthesia                         plan was to use general anesthesia. Immediately prior                         to administration of medications, the patient was                         re-assessed for adequacy to receive sedatives. The                         heart rate, respiratory rate, oxygen saturations,                         blood pressure, adequacy of pulmonary ventilation, and                         response to care were monitored throughout the                         procedure. The physical status of the patient was                         re-assessed after the procedure.                        After obtaining informed consent, the colonoscope was                         passed under direct vision. Throughout the procedure,                         the patient's blood pressure, pulse, and oxygen                         saturations were monitored continuously. The  Colonoscope was introduced through the anus and                         advanced to the the cecum, identified by appendiceal                         orifice and ileocecal valve. The colonoscopy was                         performed without difficulty. The patient tolerated                         the procedure well. The quality of the bowel                         preparation was evaluated using the BBPS [facility] Bowel                         Preparation Scale) with scores of: Right Colon = 3,                         Transverse Colon = 3 and Left Colon = 3 (entire mucosa                          seen well with no residual staining, small fragments                         of stool or opaque liquid). The total BBPS score                         equals 9. The ileocecal valve, appendiceal orifice,                         and rectum were photographed. Findings:      The perianal and digital rectal examinations were normal. Pertinent       negatives include normal sphincter tone and no palpable rectal lesions.      A 6 mm polyp was found in the transverse colon. The polyp was sessile.       The polyp was removed with a cold snare. Resection and retrieval were       complete. Estimated blood loss: none.      A 5 mm polyp was found in the rectum. The polyp was sessile. The polyp       was removed with a cold snare. Resection and retrieval were complete.       Estimated blood loss was minimal. To prevent bleeding after the       polypectomy, one hemostatic clip was successfully placed (MR safe). Clip       manufacturer: [employer]. There was no bleeding at the end of the       procedure.      The retroflexed view of the distal rectum and anal verge was normal and       showed no anal or rectal abnormalities. Impression:            - One 6 mm polyp in the transverse colon, removed with  a cold snare. Resected and retrieved.                        - One 5 mm polyp in the rectum, removed with a cold                         snare. Resected and retrieved. Clip manufacturer:                         [employer]. Clip (MR safe) was placed.                        - The distal rectum and anal verge are normal on                         retroflexion view. Recommendation:        - Discharge patient to home (with escort).                        - Resume previous diet today.                        - Continue present medications.                        - Await pathology results.                        - Repeat colonoscopy in 5 years for surveillance. Procedure  Code(s):     --- Professional ---                        [phone], Colonoscopy, flexible; with removal of                         tumor(s), polyp(s), or other lesion(s) by snare                         technique Diagnosis Code(s):     --- Professional ---                        Z12.11, Encounter for screening for malignant neoplasm                         of colon                        D12.3, Benign neoplasm of transverse colon (hepatic                         flexure or splenic flexure)                        D12.8, Benign neoplasm of rectum CPT copyright 2022 American Medical Association. All rights reserved. The codes documented in this report are preliminary and upon coder review may  be revised to meet current compliance requirements. Dr. Libby Maw Toney Reil MD, MD 05/05/2023 1:07:53 PM This report has been signed electronically. Number of Addenda: 0 Note Initiated On: 05/05/2023 12:12 PM Scope Withdrawal Time: 0 hours 12 minutes 17  seconds  Total Procedure Duration: 0 hours 16 minutes 42 seconds  Estimated Blood Loss:  Estimated blood loss: none.      [facility]

## 2023-05-06 ENCOUNTER — Encounter: Payer: Self-pay | Admitting: Gastroenterology

## 2023-05-06 LAB — SURGICAL PATHOLOGY

## 2023-05-06 NOTE — Anesthesia Postprocedure Evaluation (Signed)
Anesthesia Post Note  Patient: Tasha Chang  Procedure(s) Performed: COLONOSCOPY WITH PROPOFOL POLYPECTOMY HEMOSTASIS CLIP PLACEMENT  Patient location during evaluation: Endoscopy Anesthesia Type: General Level of consciousness: awake and alert Pain management: pain level controlled Vital Signs Assessment: post-procedure vital signs reviewed and stable Respiratory status: spontaneous breathing, nonlabored ventilation, respiratory function stable and patient connected to nasal cannula oxygen Cardiovascular status: blood pressure returned to baseline and stable Postop Assessment: no apparent nausea or vomiting Anesthetic complications: no   No notable events documented.   Last Vitals:  Vitals:   05/05/23 1309 05/05/23 1330  BP: 100/63 (!) 107/56  Pulse: 61 (!) 53  Resp:    Temp: (!) 35.8 C   SpO2:  100%    Last Pain:  Vitals:   05/05/23 1309  TempSrc: Temporal  PainSc: 0-No pain                 Corinda Gubler

## 2023-05-09 ENCOUNTER — Encounter: Payer: Self-pay | Admitting: Gastroenterology

## 2023-08-16 DIAGNOSIS — L570 Actinic keratosis: Secondary | ICD-10-CM | POA: Diagnosis not present

## 2023-08-16 DIAGNOSIS — D2272 Melanocytic nevi of left lower limb, including hip: Secondary | ICD-10-CM | POA: Diagnosis not present

## 2023-08-16 DIAGNOSIS — D2261 Melanocytic nevi of right upper limb, including shoulder: Secondary | ICD-10-CM | POA: Diagnosis not present

## 2023-08-16 DIAGNOSIS — D225 Melanocytic nevi of trunk: Secondary | ICD-10-CM | POA: Diagnosis not present

## 2023-08-16 DIAGNOSIS — D2262 Melanocytic nevi of left upper limb, including shoulder: Secondary | ICD-10-CM | POA: Diagnosis not present

## 2023-08-16 DIAGNOSIS — D2339 Other benign neoplasm of skin of other parts of face: Secondary | ICD-10-CM | POA: Diagnosis not present

## 2023-08-16 DIAGNOSIS — D485 Neoplasm of uncertain behavior of skin: Secondary | ICD-10-CM | POA: Diagnosis not present

## 2023-12-13 ENCOUNTER — Ambulatory Visit: Payer: Self-pay

## 2023-12-13 NOTE — Telephone Encounter (Signed)
 Scheduled for 12/15/2023.

## 2023-12-13 NOTE — Telephone Encounter (Signed)
 FYI Only or Action Required?: FYI only for provider  Patient was last seen in primary care on 03/21/2023 by Terre Ferri P, DO. Called Nurse Triage reporting Dysuria. Symptoms began several weeks ago. Interventions attempted: Rest, hydration, or home remedies. Symptoms are: unchanged.  Triage Disposition: See PCP Within 2 Weeks  Patient/caregiver understands and will follow disposition?: Yes  Copied from CRM [id number]. Topic: Clinical - Red Word Triage >> Dec 13, 2023  1:14 PM Fonda T wrote: [facility] that prompted transfer to Nurse Triage: Burning with urination Reason for Disposition  All other urine symptoms  Answer Assessment - Initial Assessment Questions 1. SYMPTOM: What's the main symptom you're concerned about? (e.g., frequency, incontinence)     burning 2. ONSET: When did the  burning  start?     4 weeks  3. PAIN: Is there any pain? If Yes, ask: How bad is it? (Scale: 1-10; mild, moderate, severe)     Burning 4. CAUSE: What do you think is causing the symptoms?     Unsure uti 5. OTHER SYMPTOMS: Do you have any other symptoms? (e.g., blood in urine, fever, flank pain, pain with urination)     Denies  Additional info: Mild burning is only symptoms, thought it would clear with home treatments but has not, she has increased fluids and taking cranberry supplement.  Protocols used: Urinary Symptoms-A-AH

## 2023-12-15 ENCOUNTER — Encounter: Payer: Self-pay | Admitting: Nurse Practitioner

## 2023-12-15 ENCOUNTER — Ambulatory Visit: Payer: Self-pay | Admitting: Nurse Practitioner

## 2023-12-15 ENCOUNTER — Ambulatory Visit (INDEPENDENT_AMBULATORY_CARE_PROVIDER_SITE_OTHER): Admitting: Nurse Practitioner

## 2023-12-15 VITALS — BP 101/59 | HR 58 | Temp 97.6°F | Ht 66.0 in | Wt 119.0 lb

## 2023-12-15 DIAGNOSIS — N3000 Acute cystitis without hematuria: Secondary | ICD-10-CM | POA: Diagnosis not present

## 2023-12-15 DIAGNOSIS — R300 Dysuria: Secondary | ICD-10-CM

## 2023-12-15 LAB — URINALYSIS, ROUTINE W REFLEX MICROSCOPIC
Bilirubin, UA: NEGATIVE
Glucose, UA: NEGATIVE
Ketones, UA: NEGATIVE
Leukocytes,UA: NEGATIVE
Nitrite, UA: NEGATIVE
Protein,UA: NEGATIVE
RBC, UA: NEGATIVE
Specific Gravity, UA: 1.02 (ref 1.005–1.030)
Urobilinogen, Ur: 0.2 mg/dL (ref 0.2–1.0)
pH, UA: 6 (ref 5.0–7.5)

## 2023-12-15 MED ORDER — NITROFURANTOIN MONOHYD MACRO 100 MG PO CAPS: 100.0000 mg | ORAL_CAPSULE | Freq: Two times a day (BID) | ORAL | 0 refills | Status: DC

## 2023-12-15 MED ORDER — EPINEPHRINE 0.3 MG/0.3ML IJ SOAJ: 0.3000 mg | INTRAMUSCULAR | 12 refills | Status: DC | PRN

## 2023-12-15 NOTE — Progress Notes (Signed)
 BP (!) 101/59   Pulse (!) 58   Temp 97.6 F (36.4 C) (Oral)   Ht 5' 6 (1.676 m)   Wt 119 lb (54 kg)   LMP 11/30/2023   SpO2 99%   BMI 19.21 kg/m    Subjective:    Patient ID: Tasha Chang, female    DOB: 12/30/1975, 48 y.o.   MRN: [id number]  HPI: Tasha Chang is a 48 y.o. female  Chief Complaint  Patient presents with   Dysuria   URINARY SYMPTOMS Symptoms started about 4 weeks ago Dysuria: burning Urinary frequency: yes Urgency: yes Small volume voids: yes Symptom severity: no Urinary incontinence: no Foul odor: no Hematuria: no Abdominal pain: no Back pain: no Suprapubic pain/pressure: no Flank pain: no Fever:  no Vomiting: no Relief with cranberry juice: no Relief with pyridium: no Status: stable Previous urinary tract infection: yes Recurrent urinary tract infection: no Sexual activity: No sexually active/monogomous/practicing safe sex History of sexually transmitted disease: no Penile discharge: no Treatments attempted: cranberry and increasing fluids   Relevant past medical, surgical, family and social history reviewed and updated as indicated. Interim medical history since our last visit reviewed. Allergies and medications reviewed and updated.  Review of Systems  Constitutional:  Negative for fever.  Gastrointestinal:  Negative for abdominal pain and vomiting.  Genitourinary:  Positive for dysuria, frequency and urgency. Negative for decreased urine volume, flank pain and hematuria.  Musculoskeletal:  Negative for back pain.    Per HPI unless specifically indicated above     Objective:    BP (!) 101/59   Pulse (!) 58   Temp 97.6 F (36.4 C) (Oral)   Ht 5' 6 (1.676 m)   Wt 119 lb (54 kg)   LMP 11/30/2023   SpO2 99%   BMI 19.21 kg/m   Wt Readings from Last 3 Encounters:  12/15/23 119 lb (54 kg)  05/05/23 119 lb 3.2 oz (54.1 kg)  03/21/23 123 lb 6.4 oz (56 kg)    Physical Exam Vitals and nursing note reviewed.   Constitutional:      General: She is not in acute distress.    Appearance: Normal appearance. She is normal weight. She is not ill-appearing, toxic-appearing or diaphoretic.  HENT:     Head: Normocephalic.     Right Ear: External ear normal.     Left Ear: External ear normal.     Nose: Nose normal.     Mouth/Throat:     Mouth: Mucous membranes are moist.     Pharynx: Oropharynx is clear.   Eyes:     General:        Right eye: No discharge.        Left eye: No discharge.     Extraocular Movements: Extraocular movements intact.     Conjunctiva/sclera: Conjunctivae normal.     Pupils: Pupils are equal, round, and reactive to light.    Cardiovascular:     Rate and Rhythm: Normal rate and regular rhythm.     Heart sounds: No murmur heard. Pulmonary:     Effort: Pulmonary effort is normal. No respiratory distress.     Breath sounds: Normal breath sounds. No wheezing or rales.  Abdominal:     General: Abdomen is flat. There is no distension.     Palpations: Abdomen is soft. There is no mass.     Tenderness: There is no abdominal tenderness. There is no right CVA tenderness, left CVA tenderness, guarding or rebound.  Hernia: No hernia is present.   Musculoskeletal:     Cervical back: Normal range of motion and neck supple.   Skin:    General: Skin is warm and dry.     Capillary Refill: Capillary refill takes less than 2 seconds.   Neurological:     General: No focal deficit present.     Mental Status: She is alert and oriented to person, place, and time. Mental status is at baseline.   Psychiatric:        Mood and Affect: Mood normal.        Behavior: Behavior normal.        Thought Content: Thought content normal.        Judgment: Judgment normal.     Results for orders placed or performed during the hospital encounter of 05/05/23  Surgical pathology   Collection Time: 05/05/23 12:00 AM  Result Value Ref Range   SURGICAL PATHOLOGY      SURGICAL  PATHOLOGY [facility] [address], [address] [place], [place] [address] Telephone [phone] or [phone] Fax [phone]  REPORT OF SURGICAL PATHOLOGY   Accession #: [id number] Patient Name: Tasha Chang Visit # : [id number]  MRN: [id number] Physician: Ellis Guys DOB/Age [birth year] (Age: 18) Gender: F Collected Date: 05/05/2023 Received Date: 05/05/2023  FINAL DIAGNOSIS       1. Transverse Colon Polyp, Cold snare :       - SESSILE SERRATED POLYP WITHOUT CYTOLOGIC DYSPLASIA (MULTIPLE FRAGMENTS)      - NEGATIVE FOR MALIGNANCY       2. Rectum, polyp(s), Cold snare :       - TUBULAR ADENOMA (3 FRAGMENTS)      - NEGATIVE FOR HIGH-GRADE DYSPLASIA OR MALIGNANCY       ELECTRONIC SIGNATURE : [place] M.D., Link Rice., Pathologist, Electronic Signature  MICROSCOPIC DESCRIPTION  CASE COMMENTS STAINS USED IN DIAGNOSIS: H&E H&E-2 H&E H&E H&E-2    CLINICAL HISTORY  SPECIMEN(S) OBTAINED 1. Transverse Col on Polyp, Cold Snare 2. Rectum, polyp(s), Cold Snare  SPECIMEN COMMENTS: SPECIMEN CLINICAL INFORMATION: 1. Screening for colon cancer.Colon polyps    Gross Description 1. Cold snare transverse colon polyp, received in formalin is a 1.5 x 1.5 x 0.1 cm aggregate of multiple white-tan tissue fragments.The specimen is filtered and submitted in toto in 1 block (1A). 2. Cold snared rectal polyp, received in formalin is a 1.6 x 0.6 x 0.2 cm aggregate of 3 pink-tan tissue fragments.The specimen is filtered and submitted entirely in 2 blocks (2A-B).      [facility] 05/05/2023        Report signed out from the following location(s) [place] [address] [place], [place] [address] CLIA #: [id number]  [facility] [address] [place], [place] [address] CLIA #: [id number]   Pregnancy, urine POC   Collection Time: 05/05/23 11:30 AM  Result Value Ref Range   Preg Test, Ur NEGATIVE  NEGATIVE      Assessment & Plan:   Problem List Items Addressed This Visit   None Visit Diagnoses       Acute cystitis without hematuria    -  Primary   Will treat with macrobid due to symptoms. Urine sent for culture. Will change medicaiton if needed based on urine culture. Increase fluids.   Relevant Orders   Urine Culture     Dysuria       Relevant Orders   Urinalysis, Routine w reflex microscopic  Follow up plan: No follow-ups on file.

## 2023-12-19 LAB — URINE CULTURE

## 2024-01-05 ENCOUNTER — Ambulatory Visit: Payer: Self-pay

## 2024-01-05 NOTE — Telephone Encounter (Signed)
 Patient needs an appt.

## 2024-01-05 NOTE — Telephone Encounter (Signed)
 FYI Only or Action Required?: FYI only for provider.  Patient was last seen in primary care on 12/15/2023 by Melvin Pao, NP.  Called Nurse Triage reporting Dysuria.  Symptoms began about2 months ago.  Interventions attempted: Prescription medications: Macrobid .  Symptoms are: gradually worsening.  Triage Disposition: See Physician Within 24 Hours  Patient/caregiver understands and will follow disposition?: No appointment, going to urgent care    Copied from CRM [id number]. Topic: Clinical - Medical Advice >> Jan 05, 2024 11:03 AM Sophia H wrote: Reason for CRM: Patient states just finished round of antibiotics on June 25th and feels like her UTI symptoms have returned. Wanting to know if she should come in and give another urine sample or if something different can be called in. Please advise # [phone]   [id number] - [place], [place] [phone]    Reason for Disposition  All other patients with painful urination  (Exception: [1] EITHER frequency or urgency AND [2] has on-call doctor.)  Answer Assessment - Initial Assessment Questions 1. SEVERITY: How bad is the pain?  (e.g., Scale 1-10; mild, moderate, or severe)     Mild  2. FREQUENCY: How many times have you had painful urination today?      3 today  3. PATTERN: Is pain present every time you urinate or just sometimes?      Intermittent  4. ONSET: When did the painful urination start?      1-2 months, improved with antibiotic but did not resolve fully 5. FEVER: Do you have a fever? If Yes, ask: What is your temperature, how was it measured, and when did it start?     No 6. PAST UTI: Have you had a urine infection before? If Yes, ask: When was the last time? and What happened that time?      Yes, recently treated for a UTI 7. CAUSE: What do you think is causing the painful urination?  (e.g., UTI, scratch, Herpes sore)     UTI 8. OTHER SYMPTOMS: Do you have any other  symptoms? (e.g., blood in urine, flank pain, genital sores, urgency, vaginal discharge)     No  Protocols used: Urination Pain - Female-A-AH

## 2024-03-21 ENCOUNTER — Encounter: Payer: Self-pay | Admitting: Family Medicine

## 2024-03-22 ENCOUNTER — Ambulatory Visit (INDEPENDENT_AMBULATORY_CARE_PROVIDER_SITE_OTHER): Admitting: Family Medicine

## 2024-03-22 ENCOUNTER — Encounter: Payer: Self-pay | Admitting: Family Medicine

## 2024-03-22 VITALS — BP 98/64 | HR 65 | Temp 97.8°F | Ht 66.0 in | Wt 118.4 lb

## 2024-03-22 DIAGNOSIS — E538 Deficiency of other specified B group vitamins: Secondary | ICD-10-CM | POA: Diagnosis not present

## 2024-03-22 DIAGNOSIS — Z1231 Encounter for screening mammogram for malignant neoplasm of breast: Secondary | ICD-10-CM | POA: Diagnosis not present

## 2024-03-22 DIAGNOSIS — Z0000 Encounter for general adult medical examination without abnormal findings: Secondary | ICD-10-CM

## 2024-03-22 DIAGNOSIS — E559 Vitamin D deficiency, unspecified: Secondary | ICD-10-CM | POA: Diagnosis not present

## 2024-03-22 MED ORDER — EPINEPHRINE 0.3 MG/0.3ML IJ SOAJ: 0.3000 mg | INTRAMUSCULAR | 12 refills | Status: AC | PRN

## 2024-03-22 NOTE — Progress Notes (Signed)
 BP 98/64 (BP Location: Left Arm, Patient Position: Sitting, Cuff Size: Normal)   Pulse 65   Temp 97.8 F (36.6 C) (Oral)   Ht 5' 6 (1.676 m)   Wt 118 lb 6.4 oz (53.7 kg)   LMP 03/20/2024 (Exact Date)   SpO2 97%   Breastfeeding No   BMI 19.11 kg/m    Subjective:    Patient ID: Tasha Chang, female    DOB: January 18, 1976, 48 y.o.   MRN: [id number]  HPI: Tasha Chang is a 48 y.o. female presenting on 03/22/2024 for comprehensive medical examination. Current medical complaints include:none  Menopausal Symptoms: no  Depression Screen done today and results listed below:     03/22/2024    1:16 PM 12/15/2023   11:17 AM 03/21/2023    9:05 AM 03/17/2022   11:29 AM  Depression screen PHQ 2/9  Decreased Interest 0 1 1 1   Down, Depressed, Hopeless 1 1 1 2   PHQ - 2 Score 1 2 2 3   Altered sleeping 1 0 1 1  Tired, decreased energy 0 0 0 0  Change in appetite 0 0 0 0  Feeling bad or failure about yourself  1 0 1 2  Trouble concentrating 0 0 0 1  Moving slowly or fidgety/restless 0 0 0 0  Suicidal thoughts 0 0 0 0  PHQ-9 Score 3 2 4 7   Difficult doing work/chores Not difficult at all Not difficult at all Not difficult at all Somewhat difficult    Past Medical History:  Past Medical History:  Diagnosis Date   Asthma    Migraine     Surgical History:  Past Surgical History:  Procedure Laterality Date   COLONOSCOPY WITH PROPOFOL  N/A 05/05/2023   Procedure: COLONOSCOPY WITH PROPOFOL ;  Surgeon: Unk Corinn Skiff, MD;  Location: [facility] ENDOSCOPY;  Service: Gastroenterology;  Laterality: N/A;   HEMORROIDECTOMY     HEMOSTASIS CLIP PLACEMENT  05/05/2023   Procedure: HEMOSTASIS CLIP PLACEMENT;  Surgeon: Unk Corinn Skiff, MD;  Location: [facility] ENDOSCOPY;  Service: Gastroenterology;;   POLYPECTOMY  05/05/2023   Procedure: POLYPECTOMY;  Surgeon: Unk Corinn Skiff, MD;  Location: [facility] ENDOSCOPY;  Service: Gastroenterology;;    Medications:  Current Outpatient Medications on File  Prior to Visit  Medication Sig   Cholecalciferol 50 MCG (2000 UT) CAPS Take by mouth.   cyanocobalamin  (VITAMIN B12) 1000 MCG tablet Take by mouth.   No current facility-administered medications on file prior to visit.    Allergies:  Allergies  Allergen Reactions   Other Anaphylaxis and Other (See Comments)    Walnuts,cashews,chestnuts,pecan, etc GI upset, pain/cramping   Chocolate Flavoring Agent (Non-Screening) Hives and Swelling    Social History:  Social History   Socioeconomic History   Marital status: Married    Spouse name: Not on file   Number of children: Not on file   Years of education: Not on file   Highest education level: Professional school degree (e.g., MD, DDS, DVM, JD)  Occupational History   Not on file  Tobacco Use   Smoking status: Never   Smokeless tobacco: Never  Vaping Use   Vaping status: Never Used  Substance and Sexual Activity   Alcohol use: Not Currently   Drug use: Never   Sexual activity: Yes    Birth control/protection: Condom  Other Topics Concern   Not on file  Social History Narrative   Not on file   Social Drivers of Health   Financial Resource Strain: Low Risk  (12/13/2023)  Overall Financial Resource Strain (CARDIA)    Difficulty of Paying Living Expenses: Not hard at all  Food Insecurity: No Food Insecurity (12/13/2023)   Hunger Vital Sign    Worried About Running Out of Food in the Last Year: Never true    Ran Out of Food in the Last Year: Never true  Transportation Needs: No Transportation Needs (12/13/2023)   PRAPARE - Administrator, Civil Service (Medical): No    Lack of Transportation (Non-Medical): No  Physical Activity: Sufficiently Active (12/13/2023)   Exercise Vital Sign    Days of Exercise per Week: 7 days    Minutes of Exercise per Session: 120 min  Stress: No Stress Concern Present (12/13/2023)   [employer] of Occupational Health - Occupational Stress Questionnaire    Feeling of Stress:  Only a little  Social Connections: Moderately Integrated (12/13/2023)   Social Connection and Isolation Panel    Frequency of Communication with Friends and Family: Never    Frequency of Social Gatherings with Friends and Family: More than three times a week    Attends Religious Services: Never    Database administrator or Organizations: Yes    Attends Engineer, structural: More than 4 times per year    Marital Status: Married  Catering manager Violence: Not At Risk (03/22/2024)   Humiliation, Afraid, Rape, and Kick questionnaire    Fear of Current or Ex-Partner: No    Emotionally Abused: No    Physically Abused: No    Sexually Abused: No   Social History   Tobacco Use  Smoking Status Never  Smokeless Tobacco Never   Social History   Substance and Sexual Activity  Alcohol Use Not Currently    Family History:  Family History  Problem Relation Age of Onset   Breast cancer Neg Hx     Past medical history, surgical history, medications, allergies, family history and social history reviewed with patient today and changes made to appropriate areas of the chart.   Review of Systems  Constitutional: Negative.   HENT: Negative.    Eyes:  Positive for blurred vision. Negative for double vision, photophobia, pain, discharge and redness.  Respiratory: Negative.    Cardiovascular: Negative.   Gastrointestinal: Negative.   Genitourinary: Negative.   Musculoskeletal: Negative.   Skin: Negative.   Neurological: Negative.   Endo/Heme/Allergies: Negative.   Psychiatric/Behavioral: Negative.     All other ROS negative except what is listed above and in the HPI.      Objective:    BP 98/64 (BP Location: Left Arm, Patient Position: Sitting, Cuff Size: Normal)   Pulse 65   Temp 97.8 F (36.6 C) (Oral)   Ht 5' 6 (1.676 m)   Wt 118 lb 6.4 oz (53.7 kg)   LMP 03/20/2024 (Exact Date)   SpO2 97%   Breastfeeding No   BMI 19.11 kg/m   Wt Readings from Last 3 Encounters:   03/22/24 118 lb 6.4 oz (53.7 kg)  12/15/23 119 lb (54 kg)  05/05/23 119 lb 3.2 oz (54.1 kg)    Physical Exam Vitals and nursing note reviewed.  Constitutional:      General: She is not in acute distress.    Appearance: Normal appearance. She is normal weight. She is not ill-appearing, toxic-appearing or diaphoretic.  HENT:     Head: Normocephalic and atraumatic.     Right Ear: Tympanic membrane, ear canal and external ear normal. There is no impacted cerumen.  Left Ear: Tympanic membrane, ear canal and external ear normal. There is no impacted cerumen.     Nose: Nose normal. No congestion or rhinorrhea.     Mouth/Throat:     Mouth: Mucous membranes are moist.     Pharynx: Oropharynx is clear. No oropharyngeal exudate or posterior oropharyngeal erythema.  Eyes:     General: No scleral icterus.       Right eye: No discharge.        Left eye: No discharge.     Extraocular Movements: Extraocular movements intact.     Conjunctiva/sclera: Conjunctivae normal.     Pupils: Pupils are equal, round, and reactive to light.  Neck:     Vascular: No carotid bruit.  Cardiovascular:     Rate and Rhythm: Normal rate and regular rhythm.     Pulses: Normal pulses.     Heart sounds: No murmur heard.    No friction rub. No gallop.  Pulmonary:     Effort: Pulmonary effort is normal. No respiratory distress.     Breath sounds: Normal breath sounds. No stridor. No wheezing, rhonchi or rales.  Chest:     Chest wall: No tenderness.  Abdominal:     General: Abdomen is flat. Bowel sounds are normal. There is no distension.     Palpations: Abdomen is soft. There is no mass.     Tenderness: There is no abdominal tenderness. There is no right CVA tenderness, left CVA tenderness, guarding or rebound.     Hernia: No hernia is present.  Genitourinary:    Comments: Breast and pelvic exams deferred with shared decision making Musculoskeletal:        General: No swelling, tenderness, deformity or signs  of injury.     Cervical back: Normal range of motion and neck supple. No rigidity. No muscular tenderness.     Right lower leg: No edema.     Left lower leg: No edema.  Lymphadenopathy:     Cervical: No cervical adenopathy.  Skin:    General: Skin is warm and dry.     Capillary Refill: Capillary refill takes less than 2 seconds.     Coloration: Skin is not jaundiced or pale.     Findings: No bruising, erythema, lesion or rash.  Neurological:     General: No focal deficit present.     Mental Status: She is alert and oriented to person, place, and time. Mental status is at baseline.     Cranial Nerves: No cranial nerve deficit.     Sensory: No sensory deficit.     Motor: No weakness.     Coordination: Coordination normal.     Gait: Gait normal.     Deep Tendon Reflexes: Reflexes normal.  Psychiatric:        Mood and Affect: Mood normal.        Behavior: Behavior normal.        Thought Content: Thought content normal.        Judgment: Judgment normal.     Results for orders placed or performed in visit on 12/15/23  Urinalysis, Routine w reflex microscopic   Collection Time: 12/15/23 11:17 AM  Result Value Ref Range   Specific Gravity, UA 1.020 1.005 - 1.030   pH, UA 6.0 5.0 - 7.5   Color, UA Yellow Yellow   Appearance Ur Clear Clear   Leukocytes,UA Negative Negative   Protein,UA Negative Negative/Trace   Glucose, UA Negative Negative   Ketones, UA Negative Negative  RBC, UA Negative Negative   Bilirubin, UA Negative Negative   Urobilinogen, Ur 0.2 0.2 - 1.0 mg/dL   Nitrite, UA Negative Negative   Microscopic Examination Comment   Urine Culture   Collection Time: 12/15/23 11:29 AM   Specimen: Urine   UR  Result Value Ref Range   Urine Culture, Routine Final report (A)    Organism ID, Bacteria Escherichia coli (A)    Antimicrobial Susceptibility Comment       Assessment & Plan:   Problem List Items Addressed This Visit       Other   B12 deficiency   Labs  drawn today. Await results.       Relevant Orders   B12   Vitamin D  deficiency   Labs drawn today. Await results.       Relevant Orders   VITAMIN D  25 Hydroxy (Vit-D Deficiency, Fractures)   Other Visit Diagnoses       Routine general medical examination at a health care facility    -  Primary   Vaccines up to date. Screening labs checked today. Pap up to date. Mammogram ordered. Continue diet and exercise. Call with any concerns.   Relevant Orders   CBC with Differential/Platelet   Comprehensive metabolic panel with GFR   Lipid Panel w/o Chol/HDL Ratio   TSH   Hepatitis B surface antibody,quantitative     Encounter for screening mammogram for malignant neoplasm of breast       Mammogram ordered today.   Relevant Orders   MM 3D SCREENING MAMMOGRAM BILATERAL BREAST        Follow up plan: Return in about 1 year (around 03/22/2025) for physical.   LABORATORY TESTING:  - Pap smear: not applicable  IMMUNIZATIONS:   - Tdap: Tetanus vaccination status reviewed: last tetanus booster within 10 years. - Influenza: Up to date - COVID: out of stock- will come back  SCREENING: -Mammogram: Ordered today  - Colonoscopy: Up to date   PATIENT COUNSELING:   Advised to take 1 mg of folate supplement per day if capable of pregnancy.   Sexuality: Discussed sexually transmitted diseases, partner selection, use of condoms, avoidance of unintended pregnancy  and contraceptive alternatives.   Advised to avoid cigarette smoking.  I discussed with the patient that most people either abstain from alcohol or drink within safe limits (<=14/week and <=4 drinks/occasion for males, <=7/weeks and <= 3 drinks/occasion for females) and that the risk for alcohol disorders and other health effects rises proportionally with the number of drinks per week and how often a drinker exceeds daily limits.  Discussed cessation/primary prevention of drug use and availability of treatment for abuse.   Diet:  Encouraged to adjust caloric intake to maintain  or achieve ideal body weight, to reduce intake of dietary saturated fat and total fat, to limit sodium intake by avoiding high sodium foods and not adding table salt, and to maintain adequate dietary potassium and calcium preferably from fresh fruits, vegetables, and low-fat dairy products.    stressed the importance of regular exercise  Injury prevention: Discussed safety belts, safety helmets, smoke detector, smoking near bedding or upholstery.   Dental health: Discussed importance of regular tooth brushing, flossing, and dental visits.    NEXT PREVENTATIVE PHYSICAL DUE IN 1 YEAR. Return in about 1 year (around 03/22/2025) for physical.

## 2024-03-22 NOTE — Assessment & Plan Note (Signed)
 Labs drawn today. Await results.

## 2024-03-23 ENCOUNTER — Ambulatory Visit: Payer: Self-pay | Admitting: Family Medicine

## 2024-03-23 LAB — COMPREHENSIVE METABOLIC PANEL WITH GFR
ALT: 14 IU/L (ref 0–32)
AST: 19 IU/L (ref 0–40)
Albumin: 4.4 g/dL (ref 3.9–4.9)
Alkaline Phosphatase: 80 IU/L (ref 41–116)
BUN/Creatinine Ratio: 24 — ABNORMAL HIGH (ref 9–23)
BUN: 24 mg/dL (ref 6–24)
Bilirubin Total: 0.7 mg/dL (ref 0.0–1.2)
CO2: 22 mmol/L (ref 20–29)
Calcium: 9.6 mg/dL (ref 8.7–10.2)
Chloride: 101 mmol/L (ref 96–106)
Creatinine, Ser: 1.01 mg/dL — ABNORMAL HIGH (ref 0.57–1.00)
Globulin, Total: 2.3 g/dL (ref 1.5–4.5)
Glucose: 72 mg/dL (ref 70–99)
Potassium: 4.5 mmol/L (ref 3.5–5.2)
Sodium: 139 mmol/L (ref 134–144)
Total Protein: 6.7 g/dL (ref 6.0–8.5)
eGFR: 69 mL/min/1.73 (ref >59)

## 2024-03-23 LAB — CBC WITH DIFFERENTIAL/PLATELET
Basophils Absolute: 0.1 x10E3/uL (ref 0.0–0.2)
Basos: 1 %
EOS (ABSOLUTE): 0.2 x10E3/uL (ref 0.0–0.4)
Eos: 3 %
Hematocrit: 41.8 % (ref 34.0–46.6)
Hemoglobin: 14 g/dL (ref 11.1–15.9)
Immature Grans (Abs): 0 x10E3/uL (ref 0.0–0.1)
Immature Granulocytes: 0 %
Lymphocytes Absolute: 1.4 x10E3/uL (ref 0.7–3.1)
Lymphs: 30 %
MCH: 33.7 pg — ABNORMAL HIGH (ref 26.6–33.0)
MCHC: 33.5 g/dL (ref 31.5–35.7)
MCV: 101 fL — ABNORMAL HIGH (ref 79–97)
Monocytes Absolute: 0.3 x10E3/uL (ref 0.1–0.9)
Monocytes: 6 %
Neutrophils Absolute: 2.8 x10E3/uL (ref 1.4–7.0)
Neutrophils: 60 %
Platelets: 205 x10E3/uL (ref 150–450)
RBC: 4.16 x10E6/uL (ref 3.77–5.28)
RDW: 11.6 % — ABNORMAL LOW (ref 11.7–15.4)
WBC: 4.7 x10E3/uL (ref 3.4–10.8)

## 2024-03-23 LAB — LIPID PANEL W/O CHOL/HDL RATIO
Cholesterol, Total: 213 mg/dL — ABNORMAL HIGH (ref 100–199)
HDL: 68 mg/dL (ref >39)
LDL Chol Calc (NIH): 132 mg/dL — ABNORMAL HIGH (ref 0–99)
Triglycerides: 75 mg/dL (ref 0–149)
VLDL Cholesterol Cal: 13 mg/dL (ref 5–40)

## 2024-03-23 LAB — HEPATITIS B SURFACE ANTIBODY, QUANTITATIVE: Hepatitis B Surf Ab Quant: 262 m[IU]/mL

## 2024-03-23 LAB — VITAMIN D 25 HYDROXY (VIT D DEFICIENCY, FRACTURES): Vit D, 25-Hydroxy: 48.4 ng/mL (ref 30.0–100.0)

## 2024-03-23 LAB — VITAMIN B12: Vitamin B-12: 1021 pg/mL (ref 232–1245)

## 2024-03-23 LAB — TSH: TSH: 0.955 u[IU]/mL (ref 0.450–4.500)

## 2024-04-12 ENCOUNTER — Ambulatory Visit
Admission: RE | Admit: 2024-04-12 | Discharge: 2024-04-12 | Disposition: A | Discharge status: Home / Self Care | Source: Ambulatory Visit | Attending: Family Medicine | Admitting: Family Medicine

## 2024-04-12 DIAGNOSIS — Z1231 Encounter for screening mammogram for malignant neoplasm of breast: Secondary | ICD-10-CM | POA: Insufficient documentation

## 2025-03-26 ENCOUNTER — Encounter: Admitting: Family Medicine
